# Patient Record
Sex: Female | Born: 1990 | Race: White | Hispanic: No | Marital: Married | Smoking: Never smoker
Health system: Southern US, Community
[De-identification: ages and names within clinical notes are randomized; demographics above are authoritative.]

## PROBLEM LIST (undated history)

## (undated) DIAGNOSIS — Z8669 Personal history of other diseases of the nervous system and sense organs: Secondary | ICD-10-CM

## (undated) DIAGNOSIS — O139 Gestational [pregnancy-induced] hypertension without significant proteinuria, unspecified trimester: Secondary | ICD-10-CM

## (undated) DIAGNOSIS — I10 Essential (primary) hypertension: Secondary | ICD-10-CM

## (undated) HISTORY — PX: WISDOM TOOTH EXTRACTION: SHX21

## (undated) HISTORY — PX: OTHER SURGICAL HISTORY: SHX169

---

## 2017-07-08 NOTE — L&D Delivery Note (Signed)
    IOL for PPROM/GHTN  Cytotec/foley/pitocon  Delivery Note Pr progressed steadily through labor and developed some pressure OVer a 1 push 2nd stage, at 1:57 AM a viable female was delivered via Vaginal, Spontaneous (Presentation: LOA;  ).  APGAR: 9/9 ; weight pending .  After 1 minute, the cord was clamped and cut. 40 units of pitocin diluted in 1000cc LR was infused rapidly IV.  The placenta separated spontaneously and delivered via CCT and maternal pushing effort.  It was inspected and appears to be intact with a 3 VC   Anesthesia: epidrual  Episiotomy: None Lacerations:  2nd degree Suture Repair: 2.0 vicryl Est. Blood Loss (mL):  163  Mom to postpartum.  Baby to Couplet care / Skin to Skin.    The above was performed under my direct supervision by DR. Sam Gardiner SleeperBeard    Levante Simones Cresenzo-Dishmon 05/22/2018, 1:58 AM  .

## 2017-10-16 ENCOUNTER — Encounter: Payer: Self-pay | Admitting: Family Medicine

## 2017-10-16 ENCOUNTER — Ambulatory Visit (INDEPENDENT_AMBULATORY_CARE_PROVIDER_SITE_OTHER): Payer: Self-pay | Admitting: *Deleted

## 2017-10-16 DIAGNOSIS — Z3200 Encounter for pregnancy test, result unknown: Secondary | ICD-10-CM

## 2017-10-16 DIAGNOSIS — Z3201 Encounter for pregnancy test, result positive: Secondary | ICD-10-CM

## 2017-10-16 LAB — POCT PREGNANCY, URINE: Preg Test, Ur: POSITIVE — AB

## 2017-10-16 NOTE — Progress Notes (Signed)
Pt informed of +UPT. LMP - 09/13/17.  EDD 06/20/18. Medication reconciliation completed. Pt will schedule prenatal care @ [facility].

## 2017-11-27 ENCOUNTER — Ambulatory Visit (INDEPENDENT_AMBULATORY_CARE_PROVIDER_SITE_OTHER): Payer: PRIVATE HEALTH INSURANCE | Admitting: Student

## 2017-11-27 ENCOUNTER — Encounter: Payer: Self-pay | Admitting: Student

## 2017-11-27 VITALS — BP 115/71 | HR 90 | Wt 150.5 lb

## 2017-11-27 DIAGNOSIS — Z3400 Encounter for supervision of normal first pregnancy, unspecified trimester: Secondary | ICD-10-CM

## 2017-11-27 DIAGNOSIS — Z113 Encounter for screening for infections with a predominantly sexual mode of transmission: Secondary | ICD-10-CM

## 2017-11-27 DIAGNOSIS — Z124 Encounter for screening for malignant neoplasm of cervix: Secondary | ICD-10-CM

## 2017-11-27 DIAGNOSIS — O0993 Supervision of high risk pregnancy, unspecified, third trimester: Secondary | ICD-10-CM | POA: Insufficient documentation

## 2017-11-27 DIAGNOSIS — Z3401 Encounter for supervision of normal first pregnancy, first trimester: Secondary | ICD-10-CM

## 2017-11-27 LAB — POCT URINALYSIS DIP (DEVICE)
Bilirubin Urine: NEGATIVE
GLUCOSE, UA: NEGATIVE mg/dL
KETONES UR: NEGATIVE mg/dL
Nitrite: NEGATIVE
PH: 6.5 (ref 5.0–8.0)
Protein, ur: NEGATIVE mg/dL
Specific Gravity, Urine: 1.02 (ref 1.005–1.030)
UROBILINOGEN UA: 0.2 mg/dL (ref 0.0–1.0)

## 2017-11-27 NOTE — Progress Notes (Signed)
  Subjective:    Christy Zuniga is being seen today for her first obstetrical visit.  This is a planned pregnancy. She is at [redacted]w[redacted]d gestation. Her obstetrical history is significant for nothing. . Relationship with FOB: spouse, living together. Patient does intend to breast feed. Pregnancy history fully reviewed.  Patient reports no complaints.  Review of Systems:   Review of Systems  Constitutional: Negative.   HENT: Negative.   Respiratory: Negative.   Gastrointestinal: Negative.   Genitourinary: Negative.   Musculoskeletal: Negative.   Neurological: Negative.   Psychiatric/Behavioral: Negative.     Objective:     BP 115/71   Pulse 90   Wt 150 lb 8 oz (68.3 kg)   LMP 09/13/2017  Physical Exam  Constitutional: She is oriented to person, place, and time. She appears well-developed.  HENT:  Head: Normocephalic.  Neck: Normal range of motion.  Respiratory: Effort normal.  GI: Soft.  Genitourinary:  Genitourinary Comments: Normal external female genitalia; no lesions on vaginal walls or cervix. No discharge present in vagina.  Musculoskeletal: Normal range of motion.  Neurological: She is alert and oriented to person, place, and time.   Breast exam benign: no lumps or masses palpated.  Exam    Assessment:    Pregnancy: G1P0000 Patient Active Problem List   Diagnosis Date Noted  . Supervision of normal first pregnancy, antepartum 11/27/2017       Plan:     Initial labs drawn. Prenatal vitamins. Problem list reviewed and updated. AFP3 discussed: undecided. Patient will call insurance and figure out if it covers SMA, CF and panorama test.  Role of ultrasound in pregnancy discussed; fetal survey: ordered. Amniocentesis discussed: not indicated. Follow up in 8 weeks. Oriented patient to practice, including role of CNM, PA and NPS 50% of 30  min visit spent on counseling and coordination of care.  -Pap today  -Baby RX optimization -[place] ordered for 18 weeks  -Return  precautions reviewed.  Charlesetta Garibaldi [facility] 11/27/2017

## 2017-11-27 NOTE — Patient Instructions (Signed)
First Trimester of Pregnancy The first trimester of pregnancy is from week 1 until the end of week 13 (months 1 through 3). During this time, your baby will begin to develop inside you. At 6-8 weeks, the eyes and face are formed, and the heartbeat can be seen on ultrasound. At the end of 12 weeks, all the baby's organs are formed. Prenatal care is all the medical care you receive before the birth of your baby. Make sure you get good prenatal care and follow all of your doctor's instructions. Follow these instructions at home: Medicines  Take over-the-counter and prescription medicines only as told by your doctor. Some medicines are safe and some medicines are not safe during pregnancy.  Take a prenatal vitamin that contains at least 600 micrograms (mcg) of folic acid.  If you have trouble pooping (constipation), take medicine that will make your stool soft (stool softener) if your doctor approves. Eating and drinking  Eat regular, healthy meals.  Your doctor will tell you the amount of weight gain that is right for you.  Avoid raw meat and uncooked cheese.  If you feel sick to your stomach (nauseous) or throw up (vomit): ? Eat 4 or 5 small meals a day instead of 3 large meals. ? Try eating a few soda crackers. ? Drink liquids between meals instead of during meals.  To prevent constipation: ? Eat foods that are high in fiber, like fresh fruits and vegetables, whole grains, and beans. ? Drink enough fluids to keep your pee (urine) clear or pale yellow. Activity  Exercise only as told by your doctor. Stop exercising if you have cramps or pain in your lower belly (abdomen) or low back.  Do not exercise if it is too hot, too humid, or if you are in a place of great height (high altitude).  Try to avoid standing for long periods of time. Move your legs often if you must stand in one place for a long time.  Avoid heavy lifting.  Wear low-heeled shoes. Sit and stand up straight.  You  can have sex unless your doctor tells you not to. Relieving pain and discomfort  Wear a good support bra if your breasts are sore.  Take warm water baths (sitz baths) to soothe pain or discomfort caused by hemorrhoids. Use hemorrhoid cream if your doctor says it is okay.  Rest with your legs raised if you have leg cramps or low back pain.  If you have puffy, bulging veins (varicose veins) in your legs: ? Wear support hose or compression stockings as told by your doctor. ? Raise (elevate) your feet for 15 minutes, 3-4 times a day. ? Limit salt in your food. Prenatal care  Schedule your prenatal visits by the twelfth week of pregnancy.  Write down your questions. Take them to your prenatal visits.  Keep all your prenatal visits as told by your doctor. This is important. Safety  Wear your seat belt at all times when driving.  Make a list of emergency phone numbers. The list should include numbers for family, friends, the hospital, and police and fire departments. General instructions  Ask your doctor for a referral to a local prenatal class. Begin classes no later than at the start of month 6 of your pregnancy.  Ask for help if you need counseling or if you need help with nutrition. Your doctor can give you advice or tell you where to go for help.  Do not use hot tubs, steam rooms, or   saunas.  Do not douche or use tampons or scented sanitary pads.  Do not cross your legs for long periods of time.  Avoid all herbs and alcohol. Avoid drugs that are not approved by your doctor.  Do not use any tobacco products, including cigarettes, chewing tobacco, and electronic cigarettes. If you need help quitting, ask your doctor. You may get counseling or other support to help you quit.  Avoid cat litter boxes and soil used by cats. These carry germs that can cause birth defects in the baby and can cause a loss of your baby (miscarriage) or stillbirth.  Visit your dentist. At home, brush  your teeth with a soft toothbrush. Be gentle when you floss. Contact a doctor if:  You are dizzy.  You have mild cramps or pressure in your lower belly.  You have a nagging pain in your belly area.  You continue to feel sick to your stomach, you throw up, or you have watery poop (diarrhea).  You have a bad smelling fluid coming from your vagina.  You have pain when you pee (urinate).  You have increased puffiness (swelling) in your face, hands, legs, or ankles. Get help right away if:  You have a fever.  You are leaking fluid from your vagina.  You have spotting or bleeding from your vagina.  You have very bad belly cramping or pain.  You gain or lose weight rapidly.  You throw up blood. It may look like coffee grounds.  You are around people who have German measles, fifth disease, or chickenpox.  You have a very bad headache.  You have shortness of breath.  You have any kind of trauma, such as from a fall or a car accident. Summary  The first trimester of pregnancy is from week 1 until the end of week 13 (months 1 through 3).  To take care of yourself and your unborn baby, you will need to eat healthy meals, take medicines only if your doctor tells you to do so, and do activities that are safe for you and your baby.  Keep all follow-up visits as told by your doctor. This is important as your doctor will have to ensure that your baby is healthy and growing well. This information is not intended to replace advice given to you by your health care provider. Make sure you discuss any questions you have with your health care provider. Document Released: 12/11/2007 Document Revised: 07/02/2016 Document Reviewed: 07/02/2016 Elsevier Interactive Patient Education  2017 Elsevier Inc.  

## 2017-11-28 DIAGNOSIS — Z3400 Encounter for supervision of normal first pregnancy, unspecified trimester: Secondary | ICD-10-CM

## 2017-11-28 LAB — OBSTETRIC PANEL, INCLUDING HIV
Antibody Screen: NEGATIVE
BASOS: 0 %
Basophils Absolute: 0 10*3/uL (ref 0.0–0.2)
EOS (ABSOLUTE): 0.1 10*3/uL (ref 0.0–0.4)
Eos: 1 %
HEMATOCRIT: 38.4 % (ref 34.0–46.6)
HIV SCREEN 4TH GENERATION: NONREACTIVE
Hemoglobin: 13.4 g/dL (ref 11.1–15.9)
Hepatitis B Surface Ag: NEGATIVE
IMMATURE GRANS (ABS): 0 10*3/uL (ref 0.0–0.1)
Immature Granulocytes: 0 %
LYMPHS: 30 %
Lymphocytes Absolute: 1.8 10*3/uL (ref 0.7–3.1)
MCH: 29.6 pg (ref 26.6–33.0)
MCHC: 34.9 g/dL (ref 31.5–35.7)
MCV: 85 fL (ref 79–97)
MONOCYTES: 7 %
Monocytes Absolute: 0.4 10*3/uL (ref 0.1–0.9)
NEUTROS PCT: 62 %
Neutrophils Absolute: 3.6 10*3/uL (ref 1.4–7.0)
PLATELETS: 223 10*3/uL (ref 150–450)
RBC: 4.52 x10E6/uL (ref 3.77–5.28)
RDW: 13.7 % (ref 12.3–15.4)
RPR Ser Ql: NONREACTIVE
RUBELLA: 7.84 {index} (ref >0.99)
Rh Factor: POSITIVE
WBC: 5.9 10*3/uL (ref 3.4–10.8)

## 2017-11-28 LAB — HEMOGLOBIN A1C
Est. average glucose Bld gHb Est-mCnc: 91 mg/dL
Hgb A1c MFr Bld: 4.8 % (ref 4.8–5.6)

## 2017-12-02 LAB — CYTOLOGY - PAP
CHLAMYDIA, DNA PROBE: NEGATIVE
Diagnosis: NEGATIVE
Neisseria Gonorrhea: NEGATIVE

## 2017-12-03 LAB — URINE CULTURE, OB REFLEX

## 2017-12-03 LAB — CULTURE, OB URINE

## 2018-01-01 ENCOUNTER — Ambulatory Visit (INDEPENDENT_AMBULATORY_CARE_PROVIDER_SITE_OTHER): Payer: Self-pay | Admitting: Student

## 2018-01-01 VITALS — BP 121/71 | HR 84 | Ht 62.0 in | Wt 152.7 lb

## 2018-01-01 DIAGNOSIS — Z3400 Encounter for supervision of normal first pregnancy, unspecified trimester: Secondary | ICD-10-CM

## 2018-01-01 NOTE — Progress Notes (Signed)
Patient ID: Christy Zuniga, female   DOB: 06/02/1991, 27 y.o.   MRN: [id number]     PRENATAL VISIT NOTE  Subjective:  Christy Zuniga is a 27 y.o. G1P0000 at 1936w5d being seen today for ongoing prenatal care.  She is currently monitored for the following issues for this low-risk pregnancy and has Supervision of normal first pregnancy, antepartum on their problem list.  Patient reports no complaints.  Contractions: Not present. Vag. Bleeding: None.  Movement: Absent. Denies leaking of fluid.   The following portions of the patient's history were reviewed and updated as appropriate: allergies, current medications, past family history, past medical history, past social history, past surgical history and problem list. Problem list updated.  Objective:   Vitals:   01/01/18 1111 01/01/18 1119  BP: 121/71   Pulse: 84   Weight: 152 lb 11.2 oz (69.3 kg)   Height:  5\' 2"  (1.575 m)    Fetal Status: Fetal Heart Rate (bpm): 153   Movement: Absent     General:  Alert, oriented and cooperative. Patient is in no acute distress.  Skin: Skin is warm and dry. No rash noted.   Cardiovascular: Normal heart rate noted  Respiratory: Normal respiratory effort, no problems with respiration noted  Abdomen: Soft, gravid, appropriate for gestational age.  Pain/Pressure: Absent     Pelvic: Cervical exam deferred        Extremities: Normal range of motion.  Edema: None  Mental Status: Normal mood and affect. Normal behavior. Normal judgment and thought content.   Assessment and Plan:  Pregnancy: G1P0000 at 3836w5d  1. Supervision of normal first pregnancy, antepartum -Patient doing well; declines genetic testing today other than CF. - Cystic fibrosis gene test - [facility] AMB BABYSCRIPTS SCHEDULE OPTIMIZATION -BP cuff to be sent to her house again.   Preterm labor symptoms and general obstetric precautions including but not limited to vaginal bleeding, contractions, leaking of fluid and fetal movement were reviewed in  detail with the patient. Please refer to After Visit Summary for other counseling recommendations.  Return in about 5 weeks (around 02/05/2018), or LROB.  Future Appointments  Date Time Provider Department Center  01/22/2018  8:30 AM [facility] US 1 [facility]  02/04/2018  9:55 AM Judeth HornLawrence, Erin, NP [facility]    Charlesetta GaribaldiKathryn Lorraine CordovaKooistra, [place]

## 2018-01-01 NOTE — Patient Instructions (Signed)
AREA PEDIATRIC/FAMILY PRACTICE PHYSICIANS  [facility] [address], [address] [place], [place]  [address] Phone - [phone]   Fax - [phone]  [facility] [address] [place], [place] [address] Phone - [phone]   Fax - [phone]  [facility] [address] [place], [place]  [address] Phone - [phone]   Fax - [phone]  [facility] [address] [place], [place]  [address] Phone - [phone]   Fax - [phone]  [facility] [address] [place], [place]  [address] Phone - [phone]   Fax - [phone]  [facility] [address] [place], [place]  [address] Phone - [phone]   Fax - [phone]  [facility] [address] [place], [place]  [address] Phone - [phone]   Fax - [phone]  [facility] [address] [place], [place]  [address] Phone - [phone]   Fax - [phone]  [facility] [address] [place], [place]  [address] Phone - [phone]   Fax - [phone] [facility] [address] [place], [place]  [address] Phone - [phone]   Fax - [phone]  [facility] [address] [place], [place]  [address] Phone - [phone]   Fax - [phone]  [facility] [address] [place], [place]  [address] Phone - [phone]   Fax - [phone]  [facility] [address] [place], [place]  [address] Phone - [phone]   Fax - [phone]  [facility] [address] [place], [place]  [address] Phone - [phone]  [facility] [address] [place], [place]  [address] Phone - [phone]   Fax - [phone]  [facility] [address] [place], [place]  [address] Phone - [phone]   Fax - [phone]  [facility] [address] [place], [place]  [address] Phone - [phone]   Fax - [phone]  [facility] [address] [place], [place]  [address] Phone - [phone]   Fax - [phone]   [facility] [address] [place], [place]  [address] Phone - [phone]   Fax - [phone]  [facility] [address] [place], [place]  [address] Phone - [phone]   Fax - [phone]  [facility] [address] [place], [place]  [address] Phone - [phone]   Fax - [phone]  [facility] [address] [place], [place]  [address] Phone - [phone]   Fax - [phone]  [facility] [address] , [place]  [address] Phone - [phone]   Fax - [phone] [facility] [address] [place], [place]  [address] Phone - [phone]   Fax - [phone]  [facility] [address] [place], [place]  [address] Phone - [phone]   Fax - [phone]  [facility] [address] [place], [place]  [address] Phone - [phone]   Fax - [phone]  [facility] Charlene Flemming MD [address]   [address] Phone [phone]  Fax [phone]   

## 2018-01-01 NOTE — Progress Notes (Signed)
Patient states did not get bp kit for Babyscripts optimization.  Will place order again and instructed patient to notify us if she does not get the kit. Within the next 2 weeks.  

## 2018-01-07 LAB — CYSTIC FIBROSIS GENE TEST

## 2018-01-11 ENCOUNTER — Encounter: Payer: Self-pay | Admitting: Student

## 2018-01-15 ENCOUNTER — Encounter (HOSPITAL_COMMUNITY): Payer: Self-pay

## 2018-01-22 ENCOUNTER — Other Ambulatory Visit: Payer: Self-pay | Admitting: Student

## 2018-01-22 ENCOUNTER — Ambulatory Visit (HOSPITAL_COMMUNITY)
Admission: RE | Admit: 2018-01-22 | Discharge: 2018-01-22 | Disposition: A | Discharge status: Home / Self Care | Payer: PRIVATE HEALTH INSURANCE | Source: Ambulatory Visit | Attending: Student | Admitting: Student

## 2018-01-22 DIAGNOSIS — Z3A18 18 weeks gestation of pregnancy: Secondary | ICD-10-CM

## 2018-01-22 DIAGNOSIS — Z3400 Encounter for supervision of normal first pregnancy, unspecified trimester: Secondary | ICD-10-CM

## 2018-01-22 DIAGNOSIS — O359XX0 Maternal care for (suspected) fetal abnormality and damage, unspecified, not applicable or unspecified: Secondary | ICD-10-CM

## 2018-01-22 DIAGNOSIS — Z363 Encounter for antenatal screening for malformations: Secondary | ICD-10-CM

## 2018-01-22 DIAGNOSIS — O283 Abnormal ultrasonic finding on antenatal screening of mother: Secondary | ICD-10-CM | POA: Insufficient documentation

## 2018-02-02 ENCOUNTER — Encounter: Payer: Self-pay | Admitting: Student

## 2018-02-02 DIAGNOSIS — O359XX0 Maternal care for (suspected) fetal abnormality and damage, unspecified, not applicable or unspecified: Secondary | ICD-10-CM | POA: Insufficient documentation

## 2018-02-04 ENCOUNTER — Ambulatory Visit (INDEPENDENT_AMBULATORY_CARE_PROVIDER_SITE_OTHER): Payer: PRIVATE HEALTH INSURANCE | Admitting: Student

## 2018-02-04 VITALS — BP 114/72 | HR 84 | Wt 159.8 lb

## 2018-02-04 DIAGNOSIS — O359XX0 Maternal care for (suspected) fetal abnormality and damage, unspecified, not applicable or unspecified: Secondary | ICD-10-CM

## 2018-02-04 DIAGNOSIS — Z3400 Encounter for supervision of normal first pregnancy, unspecified trimester: Secondary | ICD-10-CM

## 2018-02-04 NOTE — Progress Notes (Signed)
   PRENATAL VISIT NOTE  Subjective:  Christy Zuniga is a 27 y.o. G1P0000 at 1744w4d being seen today for ongoing prenatal care.  She is currently monitored for the following issues for this low-risk pregnancy and has Supervision of normal first pregnancy, antepartum and Suspected fetal anomaly, antepartum on their problem list.  Patient reports no complaints.  Contractions: Not present. Vag. Bleeding: None.  Movement: Present. Denies leaking of fluid.   The following portions of the patient's history were reviewed and updated as appropriate: allergies, current medications, past family history, past medical history, past social history, past surgical history and problem list. Problem list updated.  Objective:   Vitals:   02/04/18 0953  BP: 114/72  Pulse: 84  Weight: 159 lb 12.8 oz (72.5 kg)    Fetal Status: Fetal Heart Rate (bpm): 153   Movement: Present     Fundal height at umbilicus  General:  Alert, oriented and cooperative. Patient is in no acute distress.  Skin: Skin is warm and dry. No rash noted.   Cardiovascular: Normal heart rate noted  Respiratory: Normal respiratory effort, no problems with respiration noted  Abdomen: Soft, gravid, appropriate for gestational age.  Pain/Pressure: Absent     Pelvic: Cervical exam deferred        Extremities: Normal range of motion.  Edema: Trace  Mental Status: Normal mood and affect. Normal behavior. Normal judgment and thought content.   Assessment and Plan:  Pregnancy: G1P0000 at 7244w4d  1. Supervision of normal first pregnancy, antepartum -doing well -[place]anning [place] going to [place] in [place] -- questions about traveling out of the country with newborn. Will defer to pediatrician of their choice for guidance but I will research prior to next visit -Pt concerned about weight gain. Reviewed expectations of weight gain during pregnancy & went over her chart so far.  -Discussed expectations for next visit, will need to be fasting for  gtt  2. Suspected fetal anomaly, antepartum, single or unspecified fetus -Unilateral CPC. See notes from Dr. Judeth CornfieldShankar. Isolated finding, otherwise normal anatomy exam. Pt offered cfDNA by Dr. Judeth CornfieldShankar. Pt & spouse decline additional testing.   Preterm labor symptoms and general obstetric precautions including but not limited to vaginal bleeding, contractions, leaking of fluid and fetal movement were reviewed in detail with the patient. Please refer to After Visit Summary for other counseling recommendations.  Return in about 8 weeks (around 04/01/2018) for Routine OB, return at 28 wks (BRX). Will need fasting labs.  No future appointments.  Judeth HornErin Louise Rawson, NP

## 2018-02-04 NOTE — Patient Instructions (Addendum)
AREA PEDIATRIC/FAMILY PRACTICE PHYSICIANS  [facility] [address] [place], [place]  [address] Phone - [phone]   Fax - [phone]  [facility] [address] [place], [place] [address] Phone - [phone]   Fax - [phone]  JACK AMOS [address] [place], [place]  [address] Phone - [phone]   Fax - [phone]  [facility] [address] [place], [place]  [address] Phone - [phone]   Fax - [phone]  [facility] [address] [place], [place]  [address] Phone - [phone]   Fax - [phone]  [facility] [address] [place], [place]  [address] Phone - [phone]   Fax - [phone]  [facility] [place] [address] [place], [place]  [address] Phone - [phone]   Fax - [phone]  [facility] AT [facility] [address] [place], [address] [place], [place]  [address] Phone - [phone]   Fax - [phone]  [facility] AT [facility] [address] [place], [place]  [address] Phone - [phone]   Fax - [phone] [facility] [place] [address] [place], [place]  [address] Phone - [phone]   Fax - [phone]  [facility] [address] [place], [place]  [address] Phone - [phone]   Fax - [phone]  [facility] [address], [address] [place], [place]  [address] Phone - [phone]   Fax - [phone]  [facility] [place] [address] [place], [place]  [address] Phone - [phone]   Fax - [phone]  [facility] [address] [place], [place]  [address] Phone - [phone]  [facility] [address] [place], [place]  [address] Phone - [phone]   Fax - [phone]  [facility] [address] [place], [place]  [address] Phone - [phone]   Fax - [phone]  [facility] [address] [place], [place]  [address] Phone - [phone]   Fax - [phone]  [facility] [address] [place], [place]  [address] Phone - [phone]   Fax - [phone]   [facility] [address] [place], [place]  [address] Phone - [phone]   Fax - [phone]  [facility] [address] [place], [place]  [address] Phone - [phone]   Fax - [phone]  DAVID RUBIN [address] [place], [place]  [address] Phone - [phone]   Fax - [phone]  [facility] [address] [place], [place]  [address] Phone - [phone]   Fax - [phone]  [place] [facility] Alita Chyle [address] [place], [place]  [address] Phone - [phone]   Fax - [phone] Gerarda Fraction [address] [place], [place]  [address] Phone - [phone]   Fax - [phone]  [facility] [address] [place], [place]  [address] Phone - [phone]   Fax - [phone]  [facility] [place] [address] [place], [place]  [address] Phone - [phone]   Fax - [phone]  [facility] [place] Wyvonne Lenz MD [address] [place] [address] Phone [phone]  Fax [phone]      Second Trimester of Pregnancy The second trimester is from week 13  through week 28, month 4 through 6. This is often the time in pregnancy that you feel your best. Often times, morning sickness has lessened or quit. You may have more energy, and you may get hungry more often. Your unborn baby (fetus) is growing rapidly. At the end of the sixth month, he or she is about 9 inches long and weighs about 1 pounds. You will likely feel the baby move (quickening) between 18 and 20 weeks of  pregnancy.  Research childbirth classes and hospital preregistration at [email or web address]  Follow these instructions at home:  Avoid all smoking, herbs, and alcohol. Avoid drugs not approved by your doctor.  Do not use any tobacco products, including cigarettes, chewing tobacco, and electronic cigarettes. If you need help quitting, ask your doctor. You may get counseling or other support to help you quit.  Only take medicine as told by your doctor. Some medicines are safe and some are not during pregnancy.  Exercise only as told by your doctor. Stop exercising if you start having cramps.  Eat regular, healthy meals.  Wear a good support bra if your breasts are tender.  Do not use hot tubs, steam rooms, or saunas.  Wear your seat belt when driving.  Avoid raw meat, uncooked cheese, and liter boxes and soil used by cats.  Take your prenatal vitamins.  Take 1500-2000 milligrams of calcium daily starting at the 20th week of pregnancy until you deliver your baby.  Try taking medicine that helps you poop (stool softener) as needed, and if your doctor approves. Eat more fiber by eating fresh fruit, vegetables, and whole grains. Drink enough fluids to keep your pee (urine) clear or pale yellow.  Take warm water baths (sitz baths) to soothe pain or discomfort caused by hemorrhoids. Use hemorrhoid cream if your doctor approves.  If you have puffy, bulging veins (varicose veins), wear support hose. Raise (elevate) your feet for 15 minutes, 3-4 times a day. Limit salt in your diet.  Avoid heavy lifting, wear low heals, and sit up straight.  Rest with your legs raised if you have leg cramps or low back pain.  Visit your dentist if you have not gone during your pregnancy. Use a soft toothbrush to brush your teeth. Be gentle when you floss.  You can have sex (intercourse) unless your doctor tells you not to.  Go to your doctor visits.  Get help if:  You feel dizzy.  You have mild  cramps or pressure in your lower belly (abdomen).  You have a nagging pain in your belly area.  You continue to feel sick to your stomach (nauseous), throw up (vomit), or have watery poop (diarrhea).  You have bad smelling fluid coming from your vagina.  You have pain with peeing (urination). Get help right away if:  You have a fever.  You are leaking fluid from your vagina.  You have spotting or bleeding from your vagina.  You have severe belly cramping or pain.  You lose or gain weight rapidly.  You have trouble catching your breath and have chest pain.  You notice sudden or extreme puffiness (swelling) of your face, hands, ankles, feet, or legs.  You have not felt the baby move in over an hour.  You have severe headaches that do not go away with medicine.  You have vision changes. This information is not intended to replace advice given to you by your health care provider. Make sure you discuss any questions you have with your health  care provider. Document Released: 09/18/2009 Document Revised: 11/30/2015 Document Reviewed: 08/25/2012 Elsevier Interactive Patient Education  2017 [employer].

## 2018-03-31 ENCOUNTER — Other Ambulatory Visit: Payer: Self-pay | Admitting: General Practice

## 2018-03-31 DIAGNOSIS — Z3400 Encounter for supervision of normal first pregnancy, unspecified trimester: Secondary | ICD-10-CM

## 2018-04-01 ENCOUNTER — Ambulatory Visit (INDEPENDENT_AMBULATORY_CARE_PROVIDER_SITE_OTHER): Payer: PRIVATE HEALTH INSURANCE | Admitting: Obstetrics and Gynecology

## 2018-04-01 ENCOUNTER — Other Ambulatory Visit: Payer: PRIVATE HEALTH INSURANCE

## 2018-04-01 ENCOUNTER — Encounter: Payer: Self-pay | Admitting: Obstetrics and Gynecology

## 2018-04-01 DIAGNOSIS — Z3400 Encounter for supervision of normal first pregnancy, unspecified trimester: Secondary | ICD-10-CM

## 2018-04-01 DIAGNOSIS — Z23 Encounter for immunization: Secondary | ICD-10-CM | POA: Diagnosis not present

## 2018-04-01 DIAGNOSIS — Z3403 Encounter for supervision of normal first pregnancy, third trimester: Secondary | ICD-10-CM

## 2018-04-01 NOTE — Progress Notes (Addendum)
   PRENATAL VISIT NOTE  Subjective:  Christy Zuniga is a 27 y.o. G1P0000 at [redacted]w[redacted]d being seen today for ongoing prenatal care.  She is currently monitored for the following issues for this low-risk pregnancy and has Supervision of normal first pregnancy, antepartum and Suspected fetal anomaly, antepartum on their problem list.  Patient reports minor itching in small area in RUQ of abdomen, but tolerable.  Contractions: Not present. Vag. Bleeding: None.  Movement: Present. Denies leaking of fluid.   The following portions of the patient's history were reviewed and updated as appropriate: allergies, current medications, past family history, past medical history, past social history, past surgical history and problem list. Problem list updated.  Objective:   Vitals:   04/01/18 0839  BP: 117/76  Pulse: 87  Weight: 174 lb 6.4 oz (79.1 kg)    Fetal Status: Fetal Heart Rate (bpm): 140 Fundal Height: 28 cm Movement: Present     General:  Alert, oriented and cooperative. Patient is in no acute distress.  Skin: Skin is warm and dry. No rash noted.   Cardiovascular: Normal heart rate noted  Respiratory: Normal respiratory effort, no problems with respiration noted  Abdomen: Soft, gravid, appropriate for gestational age.  Pain/Pressure: Absent     Pelvic: Cervical exam deferred        Extremities: Normal range of motion.  Edema: Trace  Mental Status: Normal mood and affect. Normal behavior. Normal judgment and thought content.   Assessment and Plan:  Pregnancy: G1P0000 at [redacted]w[redacted]d  1. Supervision of normal first pregnancy, antepartum - 2 hr GTT in progress - Tdap vaccine greater than or equal to 7yo IM - Flu Vaccine QUAD 36+ mos IM - Advised apply hydrocortisone cream to affected area and take Bendryl 25 mg po hs prn increased itching.  Preterm labor symptoms and general obstetric precautions including but not limited to vaginal bleeding, contractions, leaking of fluid and fetal movement were  reviewed in detail with the patient. Please refer to After Visit Summary for other counseling recommendations.  Return in about 2 weeks (around 04/15/2018) for Return OB visit.   Raelyn Mora, CNM

## 2018-04-01 NOTE — Patient Instructions (Signed)

## 2018-04-02 LAB — RPR: RPR Ser Ql: NONREACTIVE

## 2018-04-02 LAB — CBC
HEMATOCRIT: 37.3 % (ref 34.0–46.6)
HEMOGLOBIN: 13 g/dL (ref 11.1–15.9)
MCH: 30.7 pg (ref 26.6–33.0)
MCHC: 34.9 g/dL (ref 31.5–35.7)
MCV: 88 fL (ref 79–97)
Platelets: 220 10*3/uL (ref 150–450)
RBC: 4.24 x10E6/uL (ref 3.77–5.28)
RDW: 12.7 % (ref 12.3–15.4)
WBC: 10.6 10*3/uL (ref 3.4–10.8)

## 2018-04-02 LAB — GLUCOSE TOLERANCE, 2 HOURS W/ 1HR
GLUCOSE, 2 HOUR: 116 mg/dL (ref 65–152)
Glucose, 1 hour: 113 mg/dL (ref 65–179)
Glucose, Fasting: 83 mg/dL (ref 65–91)

## 2018-04-02 LAB — HIV ANTIBODY (ROUTINE TESTING W REFLEX): HIV SCREEN 4TH GENERATION: NONREACTIVE

## 2018-04-14 ENCOUNTER — Ambulatory Visit (INDEPENDENT_AMBULATORY_CARE_PROVIDER_SITE_OTHER): Payer: PRIVATE HEALTH INSURANCE | Admitting: Obstetrics and Gynecology

## 2018-04-14 DIAGNOSIS — Z3400 Encounter for supervision of normal first pregnancy, unspecified trimester: Secondary | ICD-10-CM

## 2018-04-14 DIAGNOSIS — R1011 Right upper quadrant pain: Secondary | ICD-10-CM | POA: Insufficient documentation

## 2018-04-14 NOTE — Progress Notes (Signed)
   PRENATAL VISIT NOTE  Subjective:  Christy Zuniga is a 27 y.o. G1P0000 at [redacted]w[redacted]d being seen today for ongoing prenatal care.  She is currently monitored for the following issues for this low-risk pregnancy and has Supervision of normal first pregnancy, antepartum; Suspected fetal anomaly, antepartum; and RUQ pain on their problem list.  Patient reports RUQ pain X 12 weeks .Says it is an annoying pain that is at times deep/sometimes sharp.  No nausea and vomiting. Pain does not worsen after eating.  Contractions: Not present. Vag. Bleeding: None.  Movement: Present. Denies leaking of fluid.   The following portions of the patient's history were reviewed and updated as appropriate: allergies, current medications, past family history, past medical history, past social history, past surgical history and problem list. Problem list updated.  Objective:   Vitals:   04/14/18 0919  BP: 125/73  Pulse: 92  Weight: 176 lb (79.8 kg)    Fetal Status: Fetal Heart Rate (bpm): 149 Fundal Height: 31 cm Movement: Present     General:  Alert, oriented and cooperative. Patient is in no acute distress.  Skin: Skin is warm and dry. No rash noted.   Cardiovascular: Normal heart rate noted  Respiratory: Normal respiratory effort, no problems with respiration noted  Abdomen: Soft, gravid, appropriate for gestational age.  Pain/Pressure: Absent     Pelvic: Cervical exam deferred        Extremities: Normal range of motion.  Edema: Trace  Mental Status: Normal mood and affect. Normal behavior. Normal judgment and thought content.   Assessment and Plan:  Pregnancy: G1P0000 at [redacted]w[redacted]d  1. RUQ pain - US Abdomen Limited RUQ; Future  2. Supervision of normal first pregnancy, antepartum - discussed previous labs.   There are no diagnoses linked to this encounter. Preterm labor symptoms and general obstetric precautions including but not limited to vaginal bleeding, contractions, leaking of fluid and fetal movement  were reviewed in detail with the patient. Please refer to After Visit Summary for other counseling recommendations.  Return in about 2 weeks (around 04/28/2018).  Future Appointments  Date Time Provider Department Center  04/20/2018  9:30 AM [facility] 3 [facility]  04/29/2018 11:15 AM Katrinka Blazing, [place], CNM [facility]    Venia Carbon, NP

## 2018-04-14 NOTE — Patient Instructions (Signed)
Low-Fat Diet for Pancreatitis or Gallbladder Conditions A low-fat diet can be helpful if you have pancreatitis or a gallbladder condition. With these conditions, your pancreas and gallbladder have trouble digesting fats. A healthy eating plan with less fat will help rest your pancreas and gallbladder and reduce your symptoms. What do I need to know about this diet?  Eat a low-fat diet. ? Reduce your fat intake to less than 20-30% of your total daily calories. This is less than 50-60 g of fat per day. ? Remember that you need some fat in your diet. Ask your dietician what your daily goal should be. ? Choose nonfat and low-fat healthy foods. Look for the words "nonfat," "low fat," or "fat free." ? As a guide, look on the label and choose foods with less than 3 g of fat per serving. Eat only one serving.  Avoid alcohol.  Do not smoke. If you need help quitting, talk with your health care provider.  Eat small frequent meals instead of three large heavy meals. What foods can I eat? Grains Include healthy grains and starches such as potatoes, wheat bread, fiber-rich cereal, and [place] rice. Choose whole grain options whenever possible. In adults, whole grains should account for 45-65% of your daily calories. Fruits and Vegetables Eat plenty of fruits and vegetables. Fresh fruits and vegetables add fiber to your diet. Meats and Other Protein Sources Eat lean meat such as chicken and pork. Trim any fat off of meat before cooking it. Eggs, fish, and beans are other sources of protein. In adults, these foods should account for 10-35% of your daily calories. Dairy Choose low-fat milk and dairy options. Dairy includes fat and protein, as well as calcium. Fats and Oils Limit high-fat foods such as fried foods, sweets, baked goods, sugary drinks. Other Creamy sauces and condiments, such as mayonnaise, can add extra fat. Think about whether or not you need to use them, or use smaller amounts or low fat  options. What foods are not recommended?  High fat foods, such as: ? Baked goods. ? Ice cream. ? French toast. ? Sweet rolls. ? Pizza. ? Cheese bread. ? Foods covered with batter, butter, creamy sauces, or cheese. ? Fried foods. ? Sugary drinks and desserts.  Foods that cause gas or bloating This information is not intended to replace advice given to you by your health care provider. Make sure you discuss any questions you have with your health care provider. Document Released: 06/29/2013 Document Revised: 11/30/2015 Document Reviewed: 06/07/2013 Elsevier Interactive Patient Education  2017 [employer] Inc[employer]  

## 2018-04-20 ENCOUNTER — Ambulatory Visit (HOSPITAL_COMMUNITY)
Admission: RE | Admit: 2018-04-20 | Discharge: 2018-04-20 | Disposition: A | Discharge status: Home / Self Care | Payer: PRIVATE HEALTH INSURANCE | Source: Ambulatory Visit | Attending: Obstetrics and Gynecology | Admitting: Obstetrics and Gynecology

## 2018-04-20 DIAGNOSIS — O26893 Other specified pregnancy related conditions, third trimester: Secondary | ICD-10-CM | POA: Insufficient documentation

## 2018-04-20 DIAGNOSIS — R1011 Right upper quadrant pain: Secondary | ICD-10-CM

## 2018-04-20 DIAGNOSIS — Z3A30 30 weeks gestation of pregnancy: Secondary | ICD-10-CM | POA: Insufficient documentation

## 2018-04-29 ENCOUNTER — Ambulatory Visit (INDEPENDENT_AMBULATORY_CARE_PROVIDER_SITE_OTHER): Payer: PRIVATE HEALTH INSURANCE | Admitting: Advanced Practice Midwife

## 2018-04-29 VITALS — BP 127/89 | HR 89 | Wt 183.6 lb

## 2018-04-29 DIAGNOSIS — O0993 Supervision of high risk pregnancy, unspecified, third trimester: Secondary | ICD-10-CM

## 2018-04-29 DIAGNOSIS — Z362 Encounter for other antenatal screening follow-up: Secondary | ICD-10-CM

## 2018-04-29 DIAGNOSIS — O133 Gestational [pregnancy-induced] hypertension without significant proteinuria, third trimester: Secondary | ICD-10-CM | POA: Insufficient documentation

## 2018-04-29 DIAGNOSIS — Z3A33 33 weeks gestation of pregnancy: Secondary | ICD-10-CM

## 2018-04-29 LAB — POCT URINALYSIS DIP (DEVICE)
BILIRUBIN URINE: NEGATIVE
Glucose, UA: NEGATIVE mg/dL
HGB URINE DIPSTICK: NEGATIVE
KETONES UR: NEGATIVE mg/dL
Leukocytes, UA: NEGATIVE
Nitrite: NEGATIVE
PH: 5.5 (ref 5.0–8.0)
Protein, ur: NEGATIVE mg/dL
SPECIFIC GRAVITY, URINE: 1.01 (ref 1.005–1.030)
Urobilinogen, UA: 0.2 mg/dL (ref 0.0–1.0)

## 2018-04-29 NOTE — Progress Notes (Signed)
C/o wrist pain. BP elevated today and once at home ( noted in babyscripts) .Denies headaches or visual disturbances.

## 2018-04-29 NOTE — Patient Instructions (Addendum)
Preeclampsia and Eclampsia °Preeclampsia is a serious condition that develops only during pregnancy. It is also called toxemia of pregnancy. This condition causes high blood pressure along with other symptoms, such as swelling and headaches. These symptoms may develop as the condition gets worse. Preeclampsia may occur at 20 weeks of pregnancy or later. °Diagnosing and treating preeclampsia early is very important. If not treated early, it can cause serious problems for you and your baby. One problem it can lead to is eclampsia, which is a condition that causes muscle jerking or shaking (convulsions or seizures) in the mother. Delivering your baby is the best treatment for preeclampsia or eclampsia. Preeclampsia and eclampsia symptoms usually go away after your baby is born. °What are the causes? °The cause of preeclampsia is not known. °What increases the risk? °The following risk factors make you more likely to develop preeclampsia: °· Being pregnant for the first time. °· Having had preeclampsia during a past pregnancy. °· Having a family history of preeclampsia. °· Having high blood pressure. °· Being pregnant with twins or triplets. °· Being 35 or older. °· Being African-American. °· Having kidney disease or diabetes. °· Having medical conditions such as lupus or blood diseases. °· Being very overweight (obese). ° °What are the signs or symptoms? °The earliest signs of preeclampsia are: °· High blood pressure. °· Increased protein in your urine. Your health care provider will check for this at every visit before you give birth (prenatal visit). ° °Other symptoms that may develop as the condition gets worse include: °· Severe headaches. °· Sudden weight gain. °· Swelling of the hands, face, legs, and feet. °· Nausea and vomiting. °· Vision problems, such as blurred or double vision. °· Numbness in the face, arms, legs, and feet. °· Urinating less than usual. °· Dizziness. °· Slurred speech. °· Abdominal pain,  especially upper abdominal pain. °· Convulsions or seizures. ° °Symptoms generally go away after giving birth. °How is this diagnosed? °There are no screening tests for preeclampsia. Your health care provider will ask you about symptoms and check for signs of preeclampsia during your prenatal visits. You may also have tests that include: °· Urine tests. °· Blood tests. °· Checking your blood pressure. °· Monitoring your baby’s heart rate. °· Ultrasound. ° °How is this treated? °You and your health care provider will determine the treatment approach that is best for you. Treatment may include: °· Having more frequent prenatal exams to check for signs of preeclampsia, if you have an increased risk for preeclampsia. °· Bed rest. °· Reducing how much salt (sodium) you eat. °· Medicine to lower your blood pressure. °· Staying in the hospital, if your condition is severe. There, treatment will focus on controlling your blood pressure and the amount of fluids in your body (fluid retention). °· You may need to take medicine (magnesium sulfate) to prevent seizures. This medicine may be given as an injection or through an IV tube. °· Delivering your baby early, if your condition gets worse. You may have your labor started with medicine (induced), or you may have a cesarean delivery. ° °Follow these instructions at home: °Eating and drinking ° °· Drink enough fluid to keep your urine clear or pale yellow. °· Eat a healthy diet that is low in sodium. Do not add salt to your food. Check nutrition labels to see how much sodium a food or beverage contains. °· Avoid caffeine. °Lifestyle °· Do not use any products that contain nicotine or tobacco, such as cigarettes   and e-cigarettes. If you need help quitting, ask your health care provider. °· Do not use alcohol or drugs. °· Avoid stress as much as possible. Rest and get plenty of sleep. °General instructions °· Take over-the-counter and prescription medicines only as told by your  health care provider. °· When lying down, lie on your side. This keeps pressure off of your baby. °· When sitting or lying down, raise (elevate) your feet. Try putting some pillows underneath your lower legs. °· Exercise regularly. Ask your health care provider what kinds of exercise are best for you. °· Keep all follow-up and prenatal visits as told by your health care provider. This is important. °How is this prevented? °To prevent preeclampsia or eclampsia from developing during another pregnancy: °· Get proper medical care during pregnancy. Your health care provider may be able to prevent preeclampsia or diagnose and treat it early. °· Your health care provider may have you take a low-dose aspirin or a calcium supplement during your next pregnancy. °· You may have tests of your blood pressure and kidney function after giving birth. °· Maintain a healthy weight. Ask your health care provider for help managing weight gain during pregnancy. °· Work with your health care provider to manage any long-term (chronic) health conditions you have, such as diabetes or kidney problems. ° °Contact a health care provider if: °· You gain more weight than expected. °· You have headaches. °· You have nausea or vomiting. °· You have abdominal pain. °· You feel dizzy or light-headed. °Get help right away if: °· You develop sudden or severe swelling anywhere in your body. This usually happens in the legs. °· You gain 5 lbs (2.3 kg) or more during one week. °· You have severe: °? Abdominal pain. °? Headaches. °? Dizziness. °? Vision problems. °? Confusion. °? Nausea or vomiting. °· You have a seizure. °· You have trouble moving any part of your body. °· You develop numbness in any part of your body. °· You have trouble speaking. °· You have any abnormal bleeding. °· You pass out. °This information is not intended to replace advice given to you by your health care provider. Make sure you discuss any questions you have with your health  care provider. °Document Released: 06/21/2000 Document Revised: 02/20/2016 Document Reviewed: 01/29/2016 °Elsevier Interactive Patient Education © 2018 [employer] ° °Braxton Hicks Contractions °Contractions of the uterus can occur throughout pregnancy, but they are not always a sign that you are in labor. You may have practice contractions called Braxton Hicks contractions. These false labor contractions are sometimes confused with true labor. °What are Braxton Hicks contractions? °Braxton Hicks contractions are tightening movements that occur in the muscles of the uterus before labor. Unlike true labor contractions, these contractions do not result in opening (dilation) and thinning of the cervix. Toward the end of pregnancy (32-34 weeks), Braxton Hicks contractions can happen more often and may become stronger. These contractions are sometimes difficult to tell apart from true labor because they can be very uncomfortable. You should not feel embarrassed if you go to the hospital with false labor. °Sometimes, the only way to tell if you are in true labor is for your health care provider to look for changes in the cervix. The health care provider will do a physical exam and may monitor your contractions. If you are not in true labor, the exam should show that your cervix is not dilating and your water has not broken. °If there are other health problems   associated with your pregnancy, it is completely safe for you to be sent home with false labor. You may continue to have Braxton Hicks contractions until you go into true labor. °How to tell the difference between true labor and false labor °True labor °· Contractions last 30-70 seconds. °· Contractions become very regular. °· Discomfort is usually felt in the top of the uterus, and it spreads to the lower abdomen and low back. °· Contractions do not go away with walking. °· Contractions usually become more intense and increase in frequency. °· The cervix dilates  and gets thinner. °False labor °· Contractions are usually shorter and not as strong as true labor contractions. °· Contractions are usually irregular. °· Contractions are often felt in the front of the lower abdomen and in the groin. °· Contractions may go away when you walk around or change positions while lying down. °· Contractions get weaker and are shorter-lasting as time goes on. °· The cervix usually does not dilate or become thin. °Follow these instructions at home: °· Take over-the-counter and prescription medicines only as told by your health care provider. °· Keep up with your usual exercises and follow other instructions from your health care provider. °· Eat and drink lightly if you think you are going into labor. °· If Braxton Hicks contractions are making you uncomfortable: °? Change your position from lying down or resting to walking, or change from walking to resting. °? Sit and rest in a tub of warm water. °? Drink enough fluid to keep your urine pale yellow. Dehydration may cause these contractions. °? Do slow and deep breathing several times an hour. °· Keep all follow-up prenatal visits as told by your health care provider. This is important. °Contact a health care provider if: °· You have a fever. °· You have continuous pain in your abdomen. °Get help right away if: °· Your contractions become stronger, more regular, and closer together. °· You have fluid leaking or gushing from your vagina. °· You pass blood-tinged mucus (bloody show). °· You have bleeding from your vagina. °· You have low back pain that you never had before. °· You feel your baby’s head pushing down and causing pelvic pressure. °· Your baby is not moving inside you as much as it used to. °Summary °· Contractions that occur before labor are called Braxton Hicks contractions, false labor, or practice contractions. °· Braxton Hicks contractions are usually shorter, weaker, farther apart, and less regular than true labor  contractions. True labor contractions usually become progressively stronger and regular and they become more frequent. °· Manage discomfort from Braxton Hicks contractions by changing position, resting in a warm bath, drinking plenty of water, or practicing deep breathing. °This information is not intended to replace advice given to you by your health care provider. Make sure you discuss any questions you have with your health care provider. °Document Released: 11/07/2016 Document Revised: 11/07/2016 Document Reviewed: 11/07/2016 °Elsevier Interactive Patient Education © 2018 [employer]. ° °

## 2018-04-29 NOTE — Progress Notes (Signed)
   PRENATAL VISIT NOTE  Subjective:  Christy Zuniga is a 27 y.o. G1P0000 at [redacted]w[redacted]d being seen today for ongoing prenatal care.  She is currently monitored for the following issues for this low-risk pregnancy and has Supervision of normal first pregnancy, antepartum; Suspected fetal anomaly, antepartum; and RUQ pain on their problem list.  Patient reports imtermittent numbness in right middle finger, mostly when she wakes up in the morning. . Had one mildly elevated BP on Baby Rx cuff.  Denies HA, vision changes. Has had epigastric pain for several months. Nml RUQ [place] 04/20/18. Contractions: Not present. Vag. Bleeding: None.  Movement: Present. Denies leaking of fluid.   The following portions of the patient's history were reviewed and updated as appropriate: allergies, current medications, past family history, past medical history, past social history, past surgical history and problem list. Problem list updated.  Objective:   Vitals:   04/29/18 1142 04/29/18 1146  BP: (!) 132/94 127/89  Pulse: 93 89  Weight: 183 lb 9.6 oz (83.3 kg)     Fetal Status: Fetal Heart Rate (bpm): 145 Fundal Height: 34 cm Movement: Present  Presentation: Vertex  General:  Alert, oriented and cooperative. Patient is in no acute distress.  Skin: Skin is warm and dry. No rash noted.   Cardiovascular: Normal heart rate noted  Respiratory: Normal respiratory effort, no problems with respiration noted  Abdomen: Soft, gravid, appropriate for gestational age.  Pain/Pressure: Present     Pelvic: Cervical exam deferred        Extremities: Normal range of motion.  Edema: Mild pitting, slight indentation  Mental Status: Normal mood and affect. Normal behavior. Normal judgment and thought content.   Assessment and Plan:  Pregnancy: G1P0000 at [redacted]w[redacted]d 1. Supervision of high risk first pregnancy, antepartum   2. Gestational hypertension of pregnancy in third trimester - pre-E labs and precautions.  - BP check 10/24 (unable  to come in 48 hours.   Preterm labor symptoms and general obstetric precautions including but not limited to vaginal bleeding, contractions, leaking of fluid and fetal movement were reviewed in detail with the patient. Please refer to After Visit Summary for other counseling recommendations.  Return in about 2 weeks (around 05/13/2018).  Future Appointments  Date Time Provider Department Center  05/06/2018  1:15 PM [facility] [place] 4 [facility]    Dorathy Kinsman, [place]

## 2018-04-30 ENCOUNTER — Ambulatory Visit (INDEPENDENT_AMBULATORY_CARE_PROVIDER_SITE_OTHER): Payer: PRIVATE HEALTH INSURANCE | Admitting: *Deleted

## 2018-04-30 VITALS — BP 120/76 | HR 89 | Ht 62.0 in | Wt 183.6 lb

## 2018-04-30 DIAGNOSIS — O133 Gestational [pregnancy-induced] hypertension without significant proteinuria, third trimester: Secondary | ICD-10-CM

## 2018-04-30 LAB — COMPREHENSIVE METABOLIC PANEL
ALK PHOS: 179 IU/L — AB (ref 39–117)
ALT: 14 IU/L (ref 0–32)
AST: 20 IU/L (ref 0–40)
Albumin/Globulin Ratio: 1.4 (ref 1.2–2.2)
Albumin: 3.6 g/dL (ref 3.5–5.5)
BUN/Creatinine Ratio: 13 (ref 9–23)
BUN: 8 mg/dL (ref 6–20)
Bilirubin Total: <0.2 mg/dL (ref 0.0–1.2)
CO2: 20 mmol/L (ref 20–29)
CREATININE: 0.62 mg/dL (ref 0.57–1.00)
Calcium: 9 mg/dL (ref 8.7–10.2)
Chloride: 102 mmol/L (ref 96–106)
GFR calc non Af Amer: 125 mL/min/{1.73_m2} (ref >59)
GFR, EST AFRICAN AMERICAN: 144 mL/min/{1.73_m2} (ref >59)
Globulin, Total: 2.5 g/dL (ref 1.5–4.5)
Glucose: 68 mg/dL (ref 65–99)
Potassium: 4.3 mmol/L (ref 3.5–5.2)
Sodium: 138 mmol/L (ref 134–144)
TOTAL PROTEIN: 6.1 g/dL (ref 6.0–8.5)

## 2018-04-30 LAB — CBC
HEMOGLOBIN: 13.3 g/dL (ref 11.1–15.9)
Hematocrit: 37.3 % (ref 34.0–46.6)
MCH: 30.8 pg (ref 26.6–33.0)
MCHC: 35.7 g/dL (ref 31.5–35.7)
MCV: 86 fL (ref 79–97)
PLATELETS: 207 10*3/uL (ref 150–450)
RBC: 4.32 x10E6/uL (ref 3.77–5.28)
RDW: 12.5 % (ref 12.3–15.4)
WBC: 11 10*3/uL — ABNORMAL HIGH (ref 3.4–10.8)

## 2018-04-30 LAB — PROTEIN / CREATININE RATIO, URINE
CREATININE, UR: 27.4 mg/dL
Protein, Ur: 4.1 mg/dL
Protein/Creat Ratio: 150 mg/g creat (ref 0–200)

## 2018-04-30 NOTE — Progress Notes (Signed)
Pt here for BP check.  She denies H/A or visual disturbances. Pt advised to check BP @ home in 3 days if still not having pre-e sx. If normal, resume weekly BP checks @ home as previously instructed. She understands to return to hospital if sx of pre-e develop. Next office appt on 11/6.

## 2018-05-04 ENCOUNTER — Encounter: Payer: Self-pay | Admitting: General Practice

## 2018-05-04 ENCOUNTER — Encounter: Payer: Self-pay | Admitting: Obstetrics & Gynecology

## 2018-05-04 NOTE — Progress Notes (Signed)
I agree with the RN's note and plan of care.   Venia Carbon, NP

## 2018-05-04 NOTE — Progress Notes (Unsigned)
Patient triggered in [facility] for elevated BP's. BP 131/97 and recheck 134/96. Per Dr Macon Large, will continue to monitor at home. Patient has follow up appt 11/6. Will send mychart message.

## 2018-05-06 ENCOUNTER — Other Ambulatory Visit (HOSPITAL_COMMUNITY): Payer: Self-pay | Admitting: *Deleted

## 2018-05-06 ENCOUNTER — Ambulatory Visit (HOSPITAL_COMMUNITY)
Admission: RE | Admit: 2018-05-06 | Discharge: 2018-05-06 | Disposition: A | Discharge status: Home / Self Care | Payer: PRIVATE HEALTH INSURANCE | Source: Ambulatory Visit | Attending: Advanced Practice Midwife | Admitting: Advanced Practice Midwife

## 2018-05-06 DIAGNOSIS — O139 Gestational [pregnancy-induced] hypertension without significant proteinuria, unspecified trimester: Secondary | ICD-10-CM | POA: Diagnosis not present

## 2018-05-06 DIAGNOSIS — O133 Gestational [pregnancy-induced] hypertension without significant proteinuria, third trimester: Secondary | ICD-10-CM | POA: Insufficient documentation

## 2018-05-06 DIAGNOSIS — Z362 Encounter for other antenatal screening follow-up: Secondary | ICD-10-CM | POA: Diagnosis present

## 2018-05-06 DIAGNOSIS — Z3A33 33 weeks gestation of pregnancy: Secondary | ICD-10-CM | POA: Insufficient documentation

## 2018-05-06 DIAGNOSIS — O359XX0 Maternal care for (suspected) fetal abnormality and damage, unspecified, not applicable or unspecified: Secondary | ICD-10-CM | POA: Diagnosis not present

## 2018-05-06 DIAGNOSIS — O0993 Supervision of high risk pregnancy, unspecified, third trimester: Secondary | ICD-10-CM | POA: Diagnosis not present

## 2018-05-13 ENCOUNTER — Encounter (HOSPITAL_COMMUNITY): Payer: Self-pay

## 2018-05-13 ENCOUNTER — Encounter (HOSPITAL_COMMUNITY): Payer: Self-pay | Admitting: *Deleted

## 2018-05-13 ENCOUNTER — Inpatient Hospital Stay (HOSPITAL_COMMUNITY)
Admission: AD | Admit: 2018-05-13 | Discharge: 2018-05-13 | Disposition: A | Discharge status: Home / Self Care | Payer: PRIVATE HEALTH INSURANCE | Source: Ambulatory Visit | Attending: Obstetrics and Gynecology | Admitting: Obstetrics and Gynecology

## 2018-05-13 ENCOUNTER — Ambulatory Visit (HOSPITAL_COMMUNITY)
Admission: RE | Admit: 2018-05-13 | Discharge: 2018-05-13 | Disposition: A | Discharge status: Home / Self Care | Payer: PRIVATE HEALTH INSURANCE | Source: Ambulatory Visit | Attending: Advanced Practice Midwife | Admitting: Advanced Practice Midwife

## 2018-05-13 ENCOUNTER — Ambulatory Visit (INDEPENDENT_AMBULATORY_CARE_PROVIDER_SITE_OTHER): Payer: PRIVATE HEALTH INSURANCE | Admitting: Nurse Practitioner

## 2018-05-13 ENCOUNTER — Other Ambulatory Visit: Payer: Self-pay

## 2018-05-13 ENCOUNTER — Inpatient Hospital Stay (HOSPITAL_BASED_OUTPATIENT_CLINIC_OR_DEPARTMENT_OTHER): Payer: PRIVATE HEALTH INSURANCE

## 2018-05-13 VITALS — BP 141/95 | HR 105 | Wt 192.7 lb

## 2018-05-13 DIAGNOSIS — R030 Elevated blood-pressure reading, without diagnosis of hypertension: Secondary | ICD-10-CM | POA: Diagnosis present

## 2018-05-13 DIAGNOSIS — O359XX0 Maternal care for (suspected) fetal abnormality and damage, unspecified, not applicable or unspecified: Secondary | ICD-10-CM

## 2018-05-13 DIAGNOSIS — Z3A34 34 weeks gestation of pregnancy: Secondary | ICD-10-CM | POA: Insufficient documentation

## 2018-05-13 DIAGNOSIS — O133 Gestational [pregnancy-induced] hypertension without significant proteinuria, third trimester: Secondary | ICD-10-CM

## 2018-05-13 DIAGNOSIS — O139 Gestational [pregnancy-induced] hypertension without significant proteinuria, unspecified trimester: Secondary | ICD-10-CM

## 2018-05-13 DIAGNOSIS — O0993 Supervision of high risk pregnancy, unspecified, third trimester: Secondary | ICD-10-CM

## 2018-05-13 LAB — COMPREHENSIVE METABOLIC PANEL
ALBUMIN: 3 g/dL — AB (ref 3.5–5.0)
ALT: 17 U/L (ref 0–44)
ANION GAP: 8 (ref 5–15)
AST: 23 U/L (ref 15–41)
Alkaline Phosphatase: 200 U/L — ABNORMAL HIGH (ref 38–126)
BILIRUBIN TOTAL: 0.4 mg/dL (ref 0.3–1.2)
BUN: 12 mg/dL (ref 6–20)
CO2: 18 mmol/L — ABNORMAL LOW (ref 22–32)
Calcium: 8.5 mg/dL — ABNORMAL LOW (ref 8.9–10.3)
Chloride: 107 mmol/L (ref 98–111)
Creatinine, Ser: 0.8 mg/dL (ref 0.44–1.00)
GFR calc Af Amer: >60 mL/min (ref >60)
Glucose, Bld: 100 mg/dL — ABNORMAL HIGH (ref 70–99)
POTASSIUM: 3.8 mmol/L (ref 3.5–5.1)
Sodium: 133 mmol/L — ABNORMAL LOW (ref 135–145)
TOTAL PROTEIN: 6.2 g/dL — AB (ref 6.5–8.1)

## 2018-05-13 LAB — POCT URINALYSIS DIP (DEVICE)
Bilirubin Urine: NEGATIVE
Glucose, UA: NEGATIVE mg/dL
Hgb urine dipstick: NEGATIVE
KETONES UR: NEGATIVE mg/dL
Nitrite: NEGATIVE
PH: 6.5 (ref 5.0–8.0)
PROTEIN: NEGATIVE mg/dL
Specific Gravity, Urine: 1.02 (ref 1.005–1.030)
Urobilinogen, UA: 0.2 mg/dL (ref 0.0–1.0)

## 2018-05-13 LAB — CBC
HEMATOCRIT: 37.4 % (ref 36.0–46.0)
Hemoglobin: 12.9 g/dL (ref 12.0–15.0)
MCH: 30.2 pg (ref 26.0–34.0)
MCHC: 34.5 g/dL (ref 30.0–36.0)
MCV: 87.6 fL (ref 80.0–100.0)
Platelets: 167 10*3/uL (ref 150–400)
RBC: 4.27 MIL/uL (ref 3.87–5.11)
RDW: 13.1 % (ref 11.5–15.5)
WBC: 10.7 10*3/uL — AB (ref 4.0–10.5)
nRBC: 0 % (ref 0.0–0.2)

## 2018-05-13 LAB — PROTEIN / CREATININE RATIO, URINE
Creatinine, Urine: 115 mg/dL
Protein Creatinine Ratio: 0.12 mg/mg{Cre} (ref 0.00–0.15)
Total Protein, Urine: 14 mg/dL

## 2018-05-13 NOTE — Progress Notes (Signed)
    Subjective:  Christy Zuniga is a 27 y.o. G1P0000 at [redacted]w[redacted]d being seen today for ongoing prenatal care.  She is currently monitored for the following issues for this high-risk pregnancy and has Supervision of high risk pregnancy, antepartum, third trimester and Gestational hypertension without significant proteinuria during pregnancy in third trimester, antepartum on their problem list.  Patient reports legs are swelling worse and she had a headache for 48 hours and then it resolved..  Contractions: Not present. Vag. Bleeding: None.  Movement: Present. Denies leaking of fluid.   The following portions of the patient's history were reviewed and updated as appropriate: allergies, current medications, past family history, past medical history, past social history, past surgical history and problem list. Problem list updated.  Objective:   Vitals:   05/13/18 1133 05/13/18 1141  BP: (!) 144/93 (!) 141/95  Pulse: 93 (!) 105  Weight: 192 lb 11.2 oz (87.4 kg)     Fetal Status: Fetal Heart Rate (bpm): 152 Fundal Height: 36 cm Movement: Present     General:  Alert, oriented and cooperative. Patient is in no acute distress.  Skin: Skin is warm and dry. No rash noted.   Cardiovascular: Normal heart rate noted  Respiratory: Normal respiratory effort, no problems with respiration noted  Abdomen: Soft, gravid, appropriate for gestational age. Pain/Pressure: Present     Pelvic:  Cervical exam deferred        Extremities: Normal range of motion.  Edema: Moderate pitting, indentation subsides rapidly  Mental Status: Normal mood and affect. Normal behavior. Normal judgment and thought content.   Urinalysis:      Assessment and Plan:  Pregnancy: G1P0000 at [redacted]w[redacted]d  1. Supervision of high risk pregnancy, antepartum, third trimester Doing well but has weight gain.  Second visit with elevated BP and PIH labs were normal last visit.  2. Gestational hypertension without significant proteinuria during  pregnancy in third trimester, antepartum  9 pound weight gain Headache earlier this week 2 -3+ edema in lower legs to knees Consult with Dr. Shawnie Pons - will evaluated for [facility] labs in [facility] and will go to MFM for NST and BPP today.  Report called to [facility].  Preterm labor symptoms and general obstetric precautions including but not limited to vaginal bleeding, contractions, leaking of fluid and fetal movement were reviewed in detail with the patient. Please refer to After Visit Summary for other counseling recommendations.  Return in about 1 week (around 05/20/2018).  Nolene Bernheim, RN, MSN, NP-BC Nurse Practitioner, [facility] for [employer], [facility] 05/13/2018 12:11 PM

## 2018-05-13 NOTE — Discharge Instructions (Signed)
Hypertension During Pregnancy °Hypertension, commonly called high blood pressure, is when the force of blood pumping through your arteries is too strong. Arteries are blood vessels that carry blood from the heart throughout the body. Hypertension during pregnancy can cause problems for you and your baby. Your baby may be born early (prematurely) or may not weigh as much as he or she should at birth. Very bad cases of hypertension during pregnancy can be life-threatening. °Different types of hypertension can occur during pregnancy. These include: °· Chronic hypertension. This happens when: °? You have hypertension before pregnancy and it continues during pregnancy. °? You develop hypertension before you are [redacted] weeks pregnant, and it continues during pregnancy. °· Gestational hypertension. This is hypertension that develops after the 20th week of pregnancy. °· Preeclampsia, also called toxemia of pregnancy. This is a very serious type of hypertension that develops only during pregnancy. It affects the whole body, and it can be very dangerous for you and your baby. ° °Gestational hypertension and preeclampsia usually go away within 6 weeks after your baby is born. Women who have hypertension during pregnancy have a greater chance of developing hypertension later in life or during future pregnancies. °What are the causes? °The exact cause of hypertension is not known. °What increases the risk? °There are certain factors that make it more likely for you to develop hypertension during pregnancy. These include: °· Having hypertension during a previous pregnancy or prior to pregnancy. °· Being overweight. °· Being older than age 40. °· Being pregnant for the first time or being pregnant with more than one baby. °· Becoming pregnant using fertilization methods such as IVF (in vitro fertilization). °· Having diabetes, kidney problems, or systemic lupus erythematosus. °· Having a family history of hypertension. ° °What are the  signs or symptoms? °Chronic hypertension and gestational hypertension rarely cause symptoms. Preeclampsia causes symptoms, which may include: °· Increased protein in your urine. Your health care provider will check for this at every visit before you give birth (prenatal visit). °· Severe headaches. °· Sudden weight gain. °· Swelling of the hands, face, legs, and feet. °· Nausea and vomiting. °· Vision problems, such as blurred or double vision. °· Numbness in the face, arms, legs, and feet. °· Dizziness. °· Slurred speech. °· Sensitivity to bright lights. °· Abdominal pain. °· Convulsions. ° °How is this diagnosed? °You may be diagnosed with hypertension during a routine prenatal exam. At each prenatal visit, you may: °· Have a urine test to check for high amounts of protein in your urine. °· Have your blood pressure checked. A blood pressure reading is recorded as two numbers, such as "120 over 80" (or 120/80). The first ("top") number is called the systolic pressure. It is a measure of the pressure in your arteries when your heart beats. The second ("bottom") number is called the diastolic pressure. It is a measure of the pressure in your arteries as your heart relaxes between beats. Blood pressure is measured in a unit called mm Hg. A normal blood pressure reading is: °? Systolic: below 120. °? Diastolic: below 80. ° °The type of hypertension that you are diagnosed with depends on your test results and when your symptoms developed. °· Chronic hypertension is usually diagnosed before 20 weeks of pregnancy. °· Gestational hypertension is usually diagnosed after 20 weeks of pregnancy. °· Hypertension with high amounts of protein in the urine is diagnosed as preeclampsia. °· Blood pressure measurements that stay above 160 systolic, or above 110 diastolic, are   signs of severe preeclampsia. ° °How is this treated? °Treatment for hypertension during pregnancy varies depending on the type of hypertension you have and how  serious it is. °· If you take medicines called ACE inhibitors to treat chronic hypertension, you may need to switch medicines. ACE inhibitors should not be taken during pregnancy. °· If you have gestational hypertension, you may need to take blood pressure medicine. °· If you are at risk for preeclampsia, your health care provider may recommend that you take a low-dose aspirin every day to prevent high blood pressure during your pregnancy. °· If you have severe preeclampsia, you may need to be hospitalized so you and your baby can be monitored closely. You may also need to take medicine (magnesium sulfate) to prevent seizures and to lower blood pressure. This medicine may be given as an injection or through an IV tube. °· In some cases, if your condition gets worse, you may need to deliver your baby early. ° °Follow these instructions at home: °Eating and drinking °· Drink enough fluid to keep your urine clear or pale yellow. °· Eat a healthy diet that is low in salt (sodium). Do not add salt to your food. Check food labels to see how much sodium a food or beverage contains. °Lifestyle °· Do not use any products that contain nicotine or tobacco, such as cigarettes and e-cigarettes. If you need help quitting, ask your health care provider. °· Do not use alcohol. °· Avoid caffeine. °· Avoid stress as much as possible. Rest and get plenty of sleep. °General instructions °· Take over-the-counter and prescription medicines only as told by your health care provider. °· While lying down, lie on your left side. This keeps pressure off your baby. °· While sitting or lying down, raise (elevate) your feet. Try putting some pillows under your lower legs. °· Exercise regularly. Ask your health care provider what kinds of exercise are best for you. °· Keep all prenatal and follow-up visits as told by your health care provider. This is important. °Contact a health care provider if: °· You have symptoms that your health care  provider told you may require more treatment or monitoring, such as: °? Fever. °? Vomiting. °? Headache. °Get help right away if: °· You have severe abdominal pain or vomiting that does not get better with treatment. °· You suddenly develop swelling in your hands, ankles, or face. °· You gain 4 lbs (1.8 kg) or more in 1 week. °· You develop vaginal bleeding, or you have blood in your urine. °· You do not feel your baby moving as much as usual. °· You have blurred or double vision. °· You have muscle twitching or sudden tightening (spasms). °· You have shortness of breath. °· Your lips or fingernails turn blue. °This information is not intended to replace advice given to you by your health care provider. Make sure you discuss any questions you have with your health care provider. °Document Released: 03/12/2011 Document Revised: 01/12/2016 Document Reviewed: 12/08/2015 °Elsevier Interactive Patient Education © 2018 [employer]. ° °

## 2018-05-13 NOTE — MAU Provider Note (Signed)
Chief Complaint  Patient presents with  . BP Evaluation     First Provider Initiated Contact with Patient 05/13/18 1236      S: Janashia Parco  is a 27 y.o. y.o. year old G50P0000 female at [redacted]w[redacted]d weeks gestation who presents to MAU with elevated blood pressures. Was in office today for routine visit & was sent to MAU for labs d/t elevated Bps & swelling. Diagnosed with gestational hypertension during this pregnancy. Current blood pressure medication: none  Associated symptoms: Denies Headache, denies vision changes, denies epigastric pain Contractions: denies Vaginal bleeding: denies Fetal movement: normal  O:  Patient Vitals for the past 24 hrs:  BP Temp Temp src Pulse Resp SpO2 Height Weight  05/13/18 1419 - 98 F (36.7 C) Oral - 18 - - -  05/13/18 1401 135/85 - - 85 - - - -  05/13/18 1346 (!) 145/92 - - 90 - - - -  05/13/18 1345 (!) 137/92 - - 95 - - - -  05/13/18 1246 137/90 - - 98 - - - -  05/13/18 1235 - - - - - 99 % - -  05/13/18 1229 (!) 141/99 98.4 F (36.9 C) Oral 92 20 99 % - -  05/13/18 1220 - - - - - - 5\' 2"  (1.575 m) 87.7 kg   General: NAD Heart: Regular rate Lungs: Normal rate and effort Abd: Soft, NT, Gravid, S=D Extremities: 2 + pitting Pedal edema Neuro: 2+ deep tendon reflexes, No clonus  NST:  Baseline: 145 bpm, Variability: Good {> 6 bpm), Accelerations: Reactive and Decelerations: Absent  Results for orders placed or performed during the hospital encounter of 05/13/18 (from the past 24 hour(s))  Protein / creatinine ratio, urine     Status: None   Collection Time: 05/13/18 12:39 PM  Result Value Ref Range   Creatinine, Urine 115.00 mg/dL   Total Protein, Urine 14 mg/dL   Protein Creatinine Ratio 0.12 0.00 - 0.15 mg/mg[Cre]  CBC     Status: Abnormal   Collection Time: 05/13/18 12:41 PM  Result Value Ref Range   WBC 10.7 (H) 4.0 - 10.5 K/uL   RBC 4.27 3.87 - 5.11 MIL/uL   Hemoglobin 12.9 12.0 - 15.0 g/dL   HCT 57.8 46.9 - 62.9 %   MCV 87.6 80.0 -  100.0 fL   MCH 30.2 26.0 - 34.0 pg   MCHC 34.5 30.0 - 36.0 g/dL   RDW 52.8 41.3 - 24.4 %   Platelets 167 150 - 400 K/uL   nRBC 0.0 0.0 - 0.2 %  Comprehensive metabolic panel     Status: Abnormal   Collection Time: 05/13/18 12:41 PM  Result Value Ref Range   Sodium 133 (L) 135 - 145 mmol/L   Potassium 3.8 3.5 - 5.1 mmol/L   Chloride 107 98 - 111 mmol/L   CO2 18 (L) 22 - 32 mmol/L   Glucose, Bld 100 (H) 70 - 99 mg/dL   BUN 12 6 - 20 mg/dL   Creatinine, Ser 0.10 0.44 - 1.00 mg/dL   Calcium 8.5 (L) 8.9 - 10.3 mg/dL   Total Protein 6.2 (L) 6.5 - 8.1 g/dL   Albumin 3.0 (L) 3.5 - 5.0 g/dL   AST 23 15 - 41 U/L   ALT 17 0 - 44 U/L   Alkaline Phosphatase 200 (H) 38 - 126 U/L   Total Bilirubin 0.4 0.3 - 1.2 mg/dL   GFR calc non Af Amer >60 >60 mL/min   GFR calc Af Amer >  60 >60 mL/min   Anion gap 8 5 - 15    A: [redacted]w[redacted]d week IUP Gestational hypertension -elevated BPs, none severe range -asymptomatic -PEC labs negative FHR reactive -BPP 8/8  P: Discharge home in stable condition Preeclampsia precautions. Return to maternity admissions as needed in emergencies Pt to schedule f/u in office next week  Judeth Horn, NP 05/13/2018 8:01 PM

## 2018-05-13 NOTE — Progress Notes (Signed)
States edema in legs has gotten worse last 48 hours. C/o last Thursday had bad headache- took tylenol and slept and it went away gradually over the next 24 hours. History of migraines. BP elevated . States at home also elevated. Babyscripts data elevated.

## 2018-05-13 NOTE — MAU Note (Signed)
Pt sent from clinic for BP evaluation.

## 2018-05-15 ENCOUNTER — Telehealth: Payer: Self-pay | Admitting: General Practice

## 2018-05-15 NOTE — Telephone Encounter (Signed)
Received call from [facility] for elevated blood pressure of 143/105. Reviewed with Judeth Horn who finds BP is still not within severe range, patient is okay to continue monitoring for now unless symptomatic. Called patient to check on her. Patient denies headaches, dizziness or blurry vision, reports some fatigue. Discussed continuing to monitor BP from home and someone from our front office staff will contact her with next office appt. Patient verbalized understanding & had no questions.

## 2018-05-18 ENCOUNTER — Ambulatory Visit (INDEPENDENT_AMBULATORY_CARE_PROVIDER_SITE_OTHER): Payer: PRIVATE HEALTH INSURANCE | Admitting: Family Medicine

## 2018-05-18 ENCOUNTER — Telehealth: Payer: Self-pay | Admitting: *Deleted

## 2018-05-18 VITALS — BP 136/94 | HR 98 | Wt 195.5 lb

## 2018-05-18 DIAGNOSIS — O133 Gestational [pregnancy-induced] hypertension without significant proteinuria, third trimester: Secondary | ICD-10-CM

## 2018-05-18 DIAGNOSIS — O0993 Supervision of high risk pregnancy, unspecified, third trimester: Secondary | ICD-10-CM

## 2018-05-18 LAB — POCT URINALYSIS DIP (DEVICE)
Bilirubin Urine: NEGATIVE
Glucose, UA: NEGATIVE mg/dL
Hgb urine dipstick: NEGATIVE
Ketones, ur: NEGATIVE mg/dL
Leukocytes, UA: NEGATIVE
NITRITE: NEGATIVE
Protein, ur: 30 mg/dL — AB
Specific Gravity, Urine: 1.02 (ref 1.005–1.030)
UROBILINOGEN UA: 0.2 mg/dL (ref 0.0–1.0)
pH: 6 (ref 5.0–8.0)

## 2018-05-18 LAB — OB RESULTS CONSOLE GC/CHLAMYDIA: Gonorrhea: NEGATIVE

## 2018-05-18 NOTE — Progress Notes (Signed)
Had elevated bp Friday . Denies headaches or visual changes.

## 2018-05-18 NOTE — Progress Notes (Signed)
   PRENATAL VISIT NOTE  Subjective:  Christy Zuniga is a 27 y.o. G1P0000 at 49w2dbeing seen today for ongoing prenatal care.  She is currently monitored for the following issues for this high-risk pregnancy and has Supervision of high risk pregnancy, antepartum, third trimester and Gestational hypertension without significant proteinuria during pregnancy in third trimester, antepartum on their problem list.  Patient reports no complaints.  Contractions: Not present. Vag. Bleeding: None.  Movement: Present. Denies leaking of fluid.   The following portions of the patient's history were reviewed and updated as appropriate: allergies, current medications, past family history, past medical history, past social history, past surgical history and problem list. Problem list updated.  Objective:   Vitals:   05/18/18 1325 05/18/18 1329  BP: 139/90 (!) 136/94  Pulse: 98   Weight: 195 lb 8 oz (88.7 kg)     Fetal Status: Fetal Heart Rate (bpm): 142   Movement: Present  Presentation: Vertex  General:  Alert, oriented and cooperative. Patient is in no acute distress.  Skin: Skin is warm and dry. No rash noted.   Cardiovascular: Normal heart rate noted  Respiratory: Normal respiratory effort, no problems with respiration noted  Abdomen: Soft, gravid, appropriate for gestational age.  Pain/Pressure: Present     Pelvic: Cervical exam performed Dilation: 1.5 Effacement (%): 60 Station: -2  Extremities: Normal range of motion.  Edema: Deep pitting, indentation remains for a short time  Mental Status: Normal mood and affect. Normal behavior. Normal judgment and thought content.   Assessment and Plan:  Pregnancy: G1P0000 at 37w2d1. Supervision of high risk pregnancy, antepartum, third trimester FHT and FH normal. Check GBS today.  2. Gestational hypertension without significant proteinuria during pregnancy in third trimester, antepartum Induce at 37 weeks. BPP on wednesday - Comp Met (CMET) -  CBC  Preterm labor symptoms and general obstetric precautions including but not limited to vaginal bleeding, contractions, leaking of fluid and fetal movement were reviewed in detail with the patient. Please refer to After Visit Summary for other counseling recommendations.  Return in about 1 week (around 05/25/2018) for HR OB f/u.  Future Appointments  Date Time Provider [place]11/13/2019 12:45 PM [facility] [facility]  05/27/2018 12:45 PM [facility] [facility]  05/30/2018  7:00 AM WH-[facility]ED ROOM [facility]-BSSCHED None    JaTruett MainlandDO

## 2018-05-18 NOTE — Telephone Encounter (Signed)
Received a voicemail from 05/15/18 10:59 am stating she took a bp on babyscripts and it said she should call office to check it out. Per chart review see that Babyscripts called office to report elevated blood pressure and patient was called. She has appointment today.

## 2018-05-19 LAB — CBC
HEMATOCRIT: 36.5 % (ref 34.0–46.6)
HEMOGLOBIN: 12.8 g/dL (ref 11.1–15.9)
MCH: 30.5 pg (ref 26.6–33.0)
MCHC: 35.1 g/dL (ref 31.5–35.7)
MCV: 87 fL (ref 79–97)
PLATELETS: 184 10*3/uL (ref 150–450)
RBC: 4.2 x10E6/uL (ref 3.77–5.28)
RDW: 12.9 % (ref 12.3–15.4)
WBC: 11.7 10*3/uL — AB (ref 3.4–10.8)

## 2018-05-19 LAB — GC/CHLAMYDIA PROBE AMP (~~LOC~~) NOT AT ARMC
Chlamydia: NEGATIVE
Neisseria Gonorrhea: NEGATIVE

## 2018-05-19 LAB — COMPREHENSIVE METABOLIC PANEL
ALK PHOS: 239 IU/L — AB (ref 39–117)
ALT: 14 IU/L (ref 0–32)
AST: 17 IU/L (ref 0–40)
Albumin/Globulin Ratio: 1.4 (ref 1.2–2.2)
Albumin: 3.4 g/dL — ABNORMAL LOW (ref 3.5–5.5)
BUN/Creatinine Ratio: 13 (ref 9–23)
BUN: 12 mg/dL (ref 6–20)
Bilirubin Total: <0.2 mg/dL (ref 0.0–1.2)
CO2: 20 mmol/L (ref 20–29)
CREATININE: 0.93 mg/dL (ref 0.57–1.00)
Calcium: 8.8 mg/dL (ref 8.7–10.2)
Chloride: 102 mmol/L (ref 96–106)
GFR calc Af Amer: 98 mL/min/{1.73_m2} (ref >59)
GFR calc non Af Amer: 85 mL/min/{1.73_m2} (ref >59)
GLUCOSE: 88 mg/dL (ref 65–99)
Globulin, Total: 2.5 g/dL (ref 1.5–4.5)
Potassium: 4 mmol/L (ref 3.5–5.2)
Sodium: 137 mmol/L (ref 134–144)
Total Protein: 5.9 g/dL — ABNORMAL LOW (ref 6.0–8.5)

## 2018-05-20 ENCOUNTER — Encounter (HOSPITAL_COMMUNITY): Payer: Self-pay

## 2018-05-20 ENCOUNTER — Ambulatory Visit (HOSPITAL_BASED_OUTPATIENT_CLINIC_OR_DEPARTMENT_OTHER)
Admission: RE | Admit: 2018-05-20 | Discharge: 2018-05-20 | Disposition: A | Discharge status: Home / Self Care | Payer: PRIVATE HEALTH INSURANCE | Source: Ambulatory Visit | Attending: Advanced Practice Midwife | Admitting: Advanced Practice Midwife

## 2018-05-20 DIAGNOSIS — O139 Gestational [pregnancy-induced] hypertension without significant proteinuria, unspecified trimester: Secondary | ICD-10-CM | POA: Diagnosis not present

## 2018-05-20 DIAGNOSIS — O133 Gestational [pregnancy-induced] hypertension without significant proteinuria, third trimester: Secondary | ICD-10-CM | POA: Insufficient documentation

## 2018-05-20 DIAGNOSIS — Z3A35 35 weeks gestation of pregnancy: Secondary | ICD-10-CM

## 2018-05-20 DIAGNOSIS — O359XX0 Maternal care for (suspected) fetal abnormality and damage, unspecified, not applicable or unspecified: Secondary | ICD-10-CM

## 2018-05-20 DIAGNOSIS — O358XX0 Maternal care for other (suspected) fetal abnormality and damage, not applicable or unspecified: Secondary | ICD-10-CM | POA: Insufficient documentation

## 2018-05-21 ENCOUNTER — Inpatient Hospital Stay (HOSPITAL_COMMUNITY): Payer: PRIVATE HEALTH INSURANCE | Admitting: Anesthesiology

## 2018-05-21 ENCOUNTER — Inpatient Hospital Stay (HOSPITAL_COMMUNITY)
Admission: AD | Admit: 2018-05-21 | Discharge: 2018-05-24 | DRG: 807 | Disposition: A | Discharge status: Home / Self Care | Payer: PRIVATE HEALTH INSURANCE | Attending: Obstetrics and Gynecology | Admitting: Obstetrics and Gynecology

## 2018-05-21 ENCOUNTER — Other Ambulatory Visit: Payer: Self-pay

## 2018-05-21 ENCOUNTER — Telehealth: Payer: Self-pay | Admitting: *Deleted

## 2018-05-21 ENCOUNTER — Encounter (HOSPITAL_COMMUNITY): Payer: Self-pay | Admitting: Emergency Medicine

## 2018-05-21 DIAGNOSIS — Z880 Allergy status to penicillin: Secondary | ICD-10-CM | POA: Diagnosis not present

## 2018-05-21 DIAGNOSIS — O0993 Supervision of high risk pregnancy, unspecified, third trimester: Secondary | ICD-10-CM

## 2018-05-21 DIAGNOSIS — O42013 Preterm premature rupture of membranes, onset of labor within 24 hours of rupture, third trimester: Secondary | ICD-10-CM

## 2018-05-21 DIAGNOSIS — O42913 Preterm premature rupture of membranes, unspecified as to length of time between rupture and onset of labor, third trimester: Secondary | ICD-10-CM | POA: Diagnosis present

## 2018-05-21 DIAGNOSIS — O4290 Premature rupture of membranes, unspecified as to length of time between rupture and onset of labor, unspecified weeks of gestation: Secondary | ICD-10-CM

## 2018-05-21 DIAGNOSIS — O4202 Full-term premature rupture of membranes, onset of labor within 24 hours of rupture: Secondary | ICD-10-CM | POA: Diagnosis not present

## 2018-05-21 DIAGNOSIS — O133 Gestational [pregnancy-induced] hypertension without significant proteinuria, third trimester: Secondary | ICD-10-CM

## 2018-05-21 DIAGNOSIS — Z3A35 35 weeks gestation of pregnancy: Secondary | ICD-10-CM | POA: Diagnosis not present

## 2018-05-21 DIAGNOSIS — O42919 Preterm premature rupture of membranes, unspecified as to length of time between rupture and onset of labor, unspecified trimester: Secondary | ICD-10-CM

## 2018-05-21 DIAGNOSIS — O134 Gestational [pregnancy-induced] hypertension without significant proteinuria, complicating childbirth: Secondary | ICD-10-CM | POA: Diagnosis not present

## 2018-05-21 DIAGNOSIS — O1404 Mild to moderate pre-eclampsia, complicating childbirth: Secondary | ICD-10-CM | POA: Diagnosis present

## 2018-05-21 HISTORY — DX: Gestational (pregnancy-induced) hypertension without significant proteinuria, unspecified trimester: O13.9

## 2018-05-21 HISTORY — DX: Essential (primary) hypertension: I10

## 2018-05-21 LAB — TYPE AND SCREEN
ABO/RH(D): A POS
Antibody Screen: NEGATIVE

## 2018-05-21 LAB — URINALYSIS, ROUTINE W REFLEX MICROSCOPIC
BILIRUBIN URINE: NEGATIVE
Glucose, UA: NEGATIVE mg/dL
Ketones, ur: NEGATIVE mg/dL
LEUKOCYTES UA: NEGATIVE
NITRITE: NEGATIVE
PH: 5 (ref 5.0–8.0)
PROTEIN: 30 mg/dL — AB
SPECIFIC GRAVITY, URINE: 1.02 (ref 1.005–1.030)

## 2018-05-21 LAB — COMPREHENSIVE METABOLIC PANEL
ALK PHOS: 209 U/L — AB (ref 38–126)
ALT: 16 U/L (ref 0–44)
AST: 21 U/L (ref 15–41)
Albumin: 3 g/dL — ABNORMAL LOW (ref 3.5–5.0)
Anion gap: 9 (ref 5–15)
BUN: 13 mg/dL (ref 6–20)
CHLORIDE: 108 mmol/L (ref 98–111)
CO2: 19 mmol/L — ABNORMAL LOW (ref 22–32)
CREATININE: 0.86 mg/dL (ref 0.44–1.00)
Calcium: 8.8 mg/dL — ABNORMAL LOW (ref 8.9–10.3)
Glucose, Bld: 86 mg/dL (ref 70–99)
Potassium: 4.1 mmol/L (ref 3.5–5.1)
Sodium: 136 mmol/L (ref 135–145)
TOTAL PROTEIN: 6.9 g/dL (ref 6.5–8.1)
Total Bilirubin: 0.5 mg/dL (ref 0.3–1.2)

## 2018-05-21 LAB — CBC
HCT: 39 % (ref 36.0–46.0)
Hemoglobin: 13.2 g/dL (ref 12.0–15.0)
MCH: 29.9 pg (ref 26.0–34.0)
MCHC: 33.8 g/dL (ref 30.0–36.0)
MCV: 88.2 fL (ref 80.0–100.0)
PLATELETS: 188 10*3/uL (ref 150–400)
RBC: 4.42 MIL/uL (ref 3.87–5.11)
RDW: 13.4 % (ref 11.5–15.5)
WBC: 10.8 10*3/uL — ABNORMAL HIGH (ref 4.0–10.5)
nRBC: 0 % (ref 0.0–0.2)

## 2018-05-21 LAB — AMNISURE RUPTURE OF MEMBRANE (ROM) NOT AT ARMC: Amnisure ROM: POSITIVE

## 2018-05-21 LAB — PROTEIN / CREATININE RATIO, URINE
CREATININE, URINE: 204 mg/dL
Protein Creatinine Ratio: 0.32 mg/mg{Cre} — ABNORMAL HIGH (ref 0.00–0.15)
TOTAL PROTEIN, URINE: 65 mg/dL

## 2018-05-21 LAB — CULTURE, BETA STREP (GROUP B ONLY): Strep Gp B Culture: NEGATIVE

## 2018-05-21 LAB — ABO/RH: ABO/RH(D): A POS

## 2018-05-21 MED ORDER — LACTATED RINGERS IV SOLN: 500.0000 mL | INTRAVENOUS | Status: DC | PRN

## 2018-05-21 MED ORDER — LIDOCAINE HCL (PF) 1 % IJ SOLN
30.0000 mL | INTRAMUSCULAR | Status: DC | PRN
  Filled 2018-05-21: qty 30

## 2018-05-21 MED ORDER — VANCOMYCIN HCL IN DEXTROSE 1-5 GM/200ML-% IV SOLN
1000.0000 mg | Freq: Two times a day (BID) | INTRAVENOUS | Status: DC
  Administered 2018-05-21: 1000 mg via INTRAVENOUS
  Filled 2018-05-21 (×2): qty 200

## 2018-05-21 MED ORDER — LACTATED RINGERS IV SOLN: 500.0000 mL | Freq: Once | INTRAVENOUS | Status: DC

## 2018-05-21 MED ORDER — LACTATED RINGERS IV SOLN
INTRAVENOUS | Status: DC
  Administered 2018-05-21: 16:00:00 via INTRAVENOUS
  Administered 2018-05-21: 125 mL/h via INTRAVENOUS
  Administered 2018-05-21: 16:00:00 via INTRAVENOUS

## 2018-05-21 MED ORDER — EPHEDRINE 5 MG/ML INJ
10.0000 mg | INTRAVENOUS | Status: DC | PRN
  Filled 2018-05-21: qty 2

## 2018-05-21 MED ORDER — CLINDAMYCIN PHOSPHATE 900 MG/50ML IV SOLN: 900.0000 mg | Freq: Three times a day (TID) | INTRAVENOUS | Status: DC

## 2018-05-21 MED ORDER — OXYCODONE-ACETAMINOPHEN 5-325 MG PO TABS: 1.0000 | ORAL_TABLET | ORAL | Status: DC | PRN

## 2018-05-21 MED ORDER — DIPHENHYDRAMINE HCL 50 MG/ML IJ SOLN: 12.5000 mg | INTRAMUSCULAR | Status: DC | PRN

## 2018-05-21 MED ORDER — FENTANYL 2.5 MCG/ML BUPIVACAINE 1/10 % EPIDURAL INFUSION (WH - ANES)
14.0000 mL/h | INTRAMUSCULAR | Status: DC | PRN
  Administered 2018-05-21: 14 mL/h via EPIDURAL
  Filled 2018-05-21: qty 100

## 2018-05-21 MED ORDER — BETAMETHASONE SOD PHOS & ACET 6 (3-3) MG/ML IJ SUSP
12.0000 mg | INTRAMUSCULAR | Status: DC
  Administered 2018-05-21: 12 mg via INTRAMUSCULAR
  Filled 2018-05-21: qty 2

## 2018-05-21 MED ORDER — TERBUTALINE SULFATE 1 MG/ML IJ SOLN
0.2500 mg | Freq: Once | INTRAMUSCULAR | Status: DC | PRN
  Filled 2018-05-21: qty 1

## 2018-05-21 MED ORDER — OXYCODONE-ACETAMINOPHEN 5-325 MG PO TABS: 2.0000 | ORAL_TABLET | ORAL | Status: DC | PRN

## 2018-05-21 MED ORDER — PHENYLEPHRINE 40 MCG/ML (10ML) SYRINGE FOR IV PUSH (FOR BLOOD PRESSURE SUPPORT)
80.0000 ug | PREFILLED_SYRINGE | INTRAVENOUS | Status: DC | PRN
  Filled 2018-05-21: qty 5

## 2018-05-21 MED ORDER — ONDANSETRON HCL 4 MG/2ML IJ SOLN: 4.0000 mg | Freq: Four times a day (QID) | INTRAMUSCULAR | Status: DC | PRN

## 2018-05-21 MED ORDER — MISOPROSTOL 50MCG HALF TABLET
50.0000 ug | ORAL_TABLET | ORAL | Status: DC
  Administered 2018-05-21: 50 ug via BUCCAL
  Filled 2018-05-21 (×4): qty 1

## 2018-05-21 MED ORDER — OXYTOCIN BOLUS FROM INFUSION
500.0000 mL | Freq: Once | INTRAVENOUS | Status: AC
  Administered 2018-05-22: 500 mL via INTRAVENOUS

## 2018-05-21 MED ORDER — PHENYLEPHRINE 40 MCG/ML (10ML) SYRINGE FOR IV PUSH (FOR BLOOD PRESSURE SUPPORT)
80.0000 ug | PREFILLED_SYRINGE | INTRAVENOUS | Status: DC | PRN
  Filled 2018-05-21: qty 10
  Filled 2018-05-21: qty 5

## 2018-05-21 MED ORDER — ACETAMINOPHEN 325 MG PO TABS: 650.0000 mg | ORAL_TABLET | ORAL | Status: DC | PRN

## 2018-05-21 MED ORDER — SOD CITRATE-CITRIC ACID 500-334 MG/5ML PO SOLN: 30.0000 mL | ORAL | Status: DC | PRN

## 2018-05-21 MED ORDER — OXYTOCIN 40 UNITS IN LACTATED RINGERS INFUSION - SIMPLE MED
2.5000 [IU]/h | INTRAVENOUS | Status: DC
  Filled 2018-05-21: qty 1000

## 2018-05-21 MED ORDER — LIDOCAINE HCL (PF) 1 % IJ SOLN
INTRAMUSCULAR | Status: DC | PRN
  Administered 2018-05-21 (×2): 5 mL via EPIDURAL

## 2018-05-21 MED ORDER — OXYTOCIN 40 UNITS IN LACTATED RINGERS INFUSION - SIMPLE MED
1.0000 m[IU]/min | INTRAVENOUS | Status: DC
  Administered 2018-05-21: 2 m[IU]/min via INTRAVENOUS

## 2018-05-21 NOTE — MAU Note (Signed)
Pt presents to MAU with possible ROM @ 1945 05/20/2018. Pinkish fluid. Denies pain. Denies HA. +FM. Pt states she has GHTN.

## 2018-05-21 NOTE — H&P (Signed)
Christy Zuniga is a 27 y.o. female G1P0000 @[redacted]w[redacted]d  [facility] presenting for PPROM and GHTN.  She reports some mucus discharge starting 1945 on 05/20/18 that became more watery and soaked 2 full pads in the am of 05/21/18.  She denies any cramping/contractions or vaginal bleeding. Baby is moving well.  She was diagnosed with GHTN within the last 2 weeks with IOL planned at 37 weeks. She denies h/a, epigastric pain or visual disturbances.      Nursing Staff Provider  Office Location  [facility] Dating  LMP  Language  English Anatomy US  [facility]; declined [facility]  Flu Vaccine  04/01/18 Genetic Screen  NIPS:   AFP:   First Screen:  Quad:  Declined (insurance won't cover)  TDaP vaccine   04/01/18 Hgb A1C or  GTT Early  Third trimester  Glucose, Fasting 65 - 91 mg/dL 83   Glucose, 1 hour 65 - 179 mg/dL 161113   Glucose, 2 hour 65 - 152 mg/dL 096116     Rhogam   n/a   LAB RESULTS   Feeding Plan Breast Blood Type A/Positive/-- (05/23 0938)  O pos  Contraception Undecided Antibody Negative (05/23 04540938) neg  Circumcision Yes Rubella 7.84 (05/23 [id number]) immune  Pediatrician   list given RPR Non Reactive (09/25 0830)  NR  Support Person TYLER(FOB) HBsAg Negative (05/23 [id number]) Neg  Prenatal Classes Planning to take a tour.  HIV Non Reactive (09/25 0830) NR  BTL Consent n/a GBS  (For PCN allergy, check sensitivities)   VBAC Consent  n/a Pap  normal May 2019    Hgb Electro    declined    CF   neg    SMA     Waterbirth  [ ]  Class [ ]  Consent [ ]  CNM visit      OB History    Gravida  1   Para  0   Term  0   Preterm  0   AB  0   Living  0     SAB  0   TAB  0   Ectopic  0   Multiple  0   Live Births  0          Past Medical History:  Diagnosis Date  . Hypertension    Past Surgical History:  Procedure Laterality Date  . 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 Social History:  reports that she has never smoked. She has never used smokeless tobacco. She reports that she drank about 1.0 standard drinks of alcohol per week. She reports that she does not use drugs.2     Maternal Diabetes: No Genetic Screening: Declined Maternal Ultrasounds/Referrals: Normal Fetal Ultrasounds or other Referrals:  None Maternal Substance Abuse:  No Significant Maternal Medications:  None Significant Maternal Lab Results:  Lab values include: Other:  Other Comments:  GBS unknown, PCN allergy  Review of Systems  Constitutional: Negative for chills and fever.  Respiratory: Negative for shortness of breath.   Cardiovascular: Negative for chest pain.  Gastrointestinal: Negative for abdominal pain, constipation, diarrhea and vomiting.  Neurological: Negative for dizziness and headaches.  All other systems reviewed and are negative.  Maternal Medical History:  Reason for admission: Rupture of membranes.   Contractions: Onset was 6-12 hours ago.   Perceived severity is mild.    Fetal activity: Perceived fetal  activity is normal.   Last perceived fetal movement was within the past hour.    Prenatal complications: PIH.   Prenatal Complications - Diabetes: none.      Blood pressure (!) 147/89, pulse 89, temperature 98.5 F (36.9 C), temperature source Oral, resp. rate 17, height 5\' 2"  (1.575 m), weight 88.9 kg, last menstrual period 09/13/2017, SpO2 100 %. Maternal Exam:  Uterine Assessment: Contraction strength is mild.  Contraction frequency is rare.   Abdomen: Fetal presentation: vertex  Introitus: Amniotic fluid character: clear.  Cervix: Cervix evaluated by digital exam.     Fetal Exam Fetal Monitor Review: Mode: ultrasound.   Baseline rate: 135.  Variability: moderate (6-25 bpm).   Pattern: accelerations present and no decelerations.    Fetal State Assessment: Category I - tracings are  normal.     Physical Exam  Nursing note and vitals reviewed. Constitutional: She is oriented to person, place, and time. She appears well-developed and well-nourished.  Neck: Normal range of motion.  Cardiovascular: Normal rate, regular rhythm and normal heart sounds.  Respiratory: Effort normal.  GI: Soft.  Musculoskeletal: Normal range of motion.  Neurological: She is alert and oriented to person, place, and time. She has normal reflexes.  Skin: Skin is warm and dry.  Psychiatric: She has a normal mood and affect. Her behavior is normal. Judgment and thought content normal.    Prenatal labs: ABO, Rh: A/Positive/-- (05/23 1610) Antibody: Negative (05/23 9604) Rubella: 7.84 (05/23 5409) RPR: Non Reactive (09/25 0830)  HBsAg: Negative (05/23 8119)  HIV: Non Reactive (09/25 0830)  GBS:   Unknown--pending  Assessment/Plan: 27 y.o.  G1P0000 @[redacted]w[redacted]d  with PPROM GHTN  GBS unknown, results from 11/11 pending  [facility] for PPROM after 34 weeks BMZ x 1 given, second dose if still pregnant in 24 hours Clindamycin ordered for GBS unknown, will call to obtain pending GBS results today Anticipate NSVD   Sharen Counter 05/21/2018, 3:37 PM

## 2018-05-21 NOTE — Progress Notes (Signed)
Vitals:   05/21/18 2001 05/21/18 2031  BP: (!) 135/99 (!) 148/97  Pulse: 90 91  Resp: 18 18  Temp: 98.6 F (37 C)   SpO2:     Foley out, awaiting to start pitocin (@ 2130=4 hours after last cyttoec). FHR Cat 1, minimal ctx.

## 2018-05-21 NOTE — Progress Notes (Signed)
Labor Progress Note Christy Zuniga is a 27 y.o. G1P0000 at 706w5d presented for PROM and PEC  S:  Comfortable, no c/o. Denies PEC sx.  O:  BP (!) 137/103   Pulse 91   Temp 98.5 F (36.9 C) (Oral)   Resp 18   Ht 5\' 2"  (1.575 m)   Wt 88.9 kg   LMP 09/13/2017   SpO2 100%   BMI 35.85 kg/m  EFM: baseline 135 bpm/ mod variability/ + accels/ no decels  Toco: irregular SVE: Dilation: 1.5 Effacement (%): 70 Cervical Position: Posterior Station: -3 Presentation: Vertex Exam by:: Bahambri CNM   A/P: 27 y.o. G1P0000 316w5d  1. Labor: latent 2. FWB: Cat I 3. Pain: analgesia/anesthesia prn 4. PEC- stable, asymptomatic 5. GBS unknown>Vancomycin  Foley bulb placed. Will start Cytotec. Anticipate labor progression and SVD.  Donette LarryMelanie Alean Kromer, CNM 5:20 PM

## 2018-05-21 NOTE — Anesthesia Procedure Notes (Signed)
Epidural Patient location during procedure: OB Start time: 05/21/2018 11:01 PM End time: 05/21/2018 11:08 PM  Staffing Anesthesiologist: Achille RichHodierne, Syrai Gladwin, MD Performed: anesthesiologist   Preanesthetic Checklist Completed: patient identified, site marked, pre-op evaluation, timeout performed, IV checked, risks and benefits discussed and monitors and equipment checked  Epidural Patient position: sitting Prep: DuraPrep Patient monitoring: heart rate, cardiac monitor, continuous pulse ox and blood pressure Approach: midline Location: L2-L3 Injection technique: LOR saline  Needle:  Needle type: Tuohy  Needle gauge: 17 G Needle length: 9 cm Needle insertion depth: 6 cm Catheter type: closed end flexible Catheter size: 19 Gauge Catheter at skin depth: 12 cm Test dose: negative and Other  Assessment Events: blood not aspirated, injection not painful, no injection resistance and negative IV test  Additional Notes Informed consent obtained prior to proceeding including risk of failure, 1% risk of PDPH, risk of minor discomfort and bruising.  Discussed rare but serious complications including epidural abscess, permanent nerve injury, epidural hematoma.  Discussed alternatives to epidural analgesia and patient desires to proceed.  Timeout performed pre-procedure verifying patient name, procedure, and platelet count.  Patient tolerated procedure well. Reason for block:procedure for pain

## 2018-05-21 NOTE — Anesthesia Preprocedure Evaluation (Signed)
Anesthesia Evaluation  Patient identified by MRN, date of birth, ID band Patient awake    Reviewed: Allergy & Precautions, H&P , NPO status , Patient's Chart, lab work & pertinent test results  Airway Mallampati: II   Neck ROM: full    Dental   Pulmonary neg pulmonary ROS,    breath sounds clear to auscultation       Cardiovascular hypertension,  Rhythm:regular Rate:Normal     Neuro/Psych    GI/Hepatic   Endo/Other    Renal/GU      Musculoskeletal   Abdominal   Peds  Hematology   Anesthesia Other Findings   Reproductive/Obstetrics (+) Pregnancy                             Anesthesia Physical Anesthesia Plan  ASA: I  Anesthesia Plan: Epidural   Post-op Pain Management:    Induction: Intravenous  PONV Risk Score and Plan: 2 and Treatment may vary due to age or medical condition  Airway Management Planned: Natural Airway  Additional Equipment:   Intra-op Plan:   Post-operative Plan:   Informed Consent: I have reviewed the patients History and Physical, chart, labs and discussed the procedure including the risks, benefits and alternatives for the proposed anesthesia with the patient or authorized representative who has indicated his/her understanding and acceptance.       Plan Discussed with: Anesthesiologist  Anesthesia Plan Comments:         Anesthesia Quick Evaluation  

## 2018-05-21 NOTE — Telephone Encounter (Signed)
Christy Zuniga called and left a message she started leaking pinkish fluid since about 4pm yesterday and doesn't want to overreact.  I called Christy Zuniga and she states is still trickling out a little at a time. I advised her to come to [facility] for evaluation if her membranes have ruptured. She states she can come to [facility]au in a few minutes and voices understanding.

## 2018-05-22 ENCOUNTER — Encounter (HOSPITAL_COMMUNITY): Payer: Self-pay

## 2018-05-22 DIAGNOSIS — O4202 Full-term premature rupture of membranes, onset of labor within 24 hours of rupture: Secondary | ICD-10-CM

## 2018-05-22 DIAGNOSIS — O134 Gestational [pregnancy-induced] hypertension without significant proteinuria, complicating childbirth: Secondary | ICD-10-CM

## 2018-05-22 DIAGNOSIS — Z3A35 35 weeks gestation of pregnancy: Secondary | ICD-10-CM

## 2018-05-22 LAB — CBC
HEMATOCRIT: 34.4 % — AB (ref 36.0–46.0)
HEMOGLOBIN: 12.1 g/dL (ref 12.0–15.0)
MCH: 30.9 pg (ref 26.0–34.0)
MCHC: 35.2 g/dL (ref 30.0–36.0)
MCV: 87.8 fL (ref 80.0–100.0)
NRBC: 0.2 % (ref 0.0–0.2)
Platelets: 176 10*3/uL (ref 150–400)
RBC: 3.92 MIL/uL (ref 3.87–5.11)
RDW: 13.4 % (ref 11.5–15.5)
WBC: 13.9 10*3/uL — AB (ref 4.0–10.5)

## 2018-05-22 LAB — RPR: RPR: NONREACTIVE

## 2018-05-22 MED ORDER — METHYLERGONOVINE MALEATE 0.2 MG PO TABS: 0.2000 mg | ORAL_TABLET | ORAL | Status: DC | PRN

## 2018-05-22 MED ORDER — TETANUS-DIPHTH-ACELL PERTUSSIS 5-2.5-18.5 LF-MCG/0.5 IM SUSP: 0.5000 mL | Freq: Once | INTRAMUSCULAR | Status: DC

## 2018-05-22 MED ORDER — BENZOCAINE-MENTHOL 20-0.5 % EX AERO
1.0000 "application " | INHALATION_SPRAY | CUTANEOUS | Status: DC | PRN
  Administered 2018-05-22: 1 via TOPICAL
  Filled 2018-05-22: qty 56

## 2018-05-22 MED ORDER — MEASLES, MUMPS & RUBELLA VAC IJ SOLR
0.5000 mL | Freq: Once | INTRAMUSCULAR | Status: DC
  Filled 2018-05-22: qty 0.5

## 2018-05-22 MED ORDER — BISACODYL 10 MG RE SUPP
10.0000 mg | Freq: Every day | RECTAL | Status: DC | PRN
  Administered 2018-05-23: 10 mg via RECTAL
  Filled 2018-05-22: qty 1

## 2018-05-22 MED ORDER — PRENATAL MULTIVITAMIN CH
1.0000 | ORAL_TABLET | Freq: Every day | ORAL | Status: DC
  Administered 2018-05-22 – 2018-05-23 (×2): 1 via ORAL
  Filled 2018-05-22 (×2): qty 1

## 2018-05-22 MED ORDER — FERROUS SULFATE 325 (65 FE) MG PO TABS
325.0000 mg | ORAL_TABLET | Freq: Two times a day (BID) | ORAL | Status: DC
  Administered 2018-05-22 – 2018-05-23 (×3): 325 mg via ORAL
  Filled 2018-05-22 (×4): qty 1

## 2018-05-22 MED ORDER — SIMETHICONE 80 MG PO CHEW
80.0000 mg | CHEWABLE_TABLET | ORAL | Status: DC | PRN
  Administered 2018-05-23: 80 mg via ORAL

## 2018-05-22 MED ORDER — IBUPROFEN 600 MG PO TABS
600.0000 mg | ORAL_TABLET | Freq: Four times a day (QID) | ORAL | Status: DC
  Administered 2018-05-22 – 2018-05-24 (×9): 600 mg via ORAL
  Filled 2018-05-22 (×9): qty 1

## 2018-05-22 MED ORDER — ACETAMINOPHEN 325 MG PO TABS: 650.0000 mg | ORAL_TABLET | ORAL | Status: DC | PRN

## 2018-05-22 MED ORDER — ONDANSETRON HCL 4 MG PO TABS: 4.0000 mg | ORAL_TABLET | ORAL | Status: DC | PRN

## 2018-05-22 MED ORDER — FLEET ENEMA 7-19 GM/118ML RE ENEM: 1.0000 | ENEMA | Freq: Every day | RECTAL | Status: DC | PRN

## 2018-05-22 MED ORDER — DIBUCAINE 1 % RE OINT: 1.0000 "application " | TOPICAL_OINTMENT | RECTAL | Status: DC | PRN

## 2018-05-22 MED ORDER — WITCH HAZEL-GLYCERIN EX PADS: 1.0000 "application " | MEDICATED_PAD | CUTANEOUS | Status: DC | PRN

## 2018-05-22 MED ORDER — ONDANSETRON HCL 4 MG/2ML IJ SOLN: 4.0000 mg | INTRAMUSCULAR | Status: DC | PRN

## 2018-05-22 MED ORDER — COCONUT OIL OIL
1.0000 "application " | TOPICAL_OIL | Status: DC | PRN
  Filled 2018-05-22: qty 120

## 2018-05-22 MED ORDER — METHYLERGONOVINE MALEATE 0.2 MG/ML IJ SOLN: 0.2000 mg | INTRAMUSCULAR | Status: DC | PRN

## 2018-05-22 NOTE — Lactation Note (Addendum)
This note was copied from a baby's chart. Lactation Consultation Note  Patient Name: Christy Zuniga ZOXWR'UToday's Date: 05/22/2018  LPTI currently 16 hours old.  Visitors in room.  Mom denies need at this time.  Quickly reviewed LPTI guidelines with mom.  She has LPTI green sheet.  She has baby feeding record(yellow) sheet.  Urged her to fill out all feeding,voids/poops,emesis. Gave breastfeeding Resource list and Breastfeeding consultation services handouts.Encouraged to come to [facility]. Reviewed Understanding Mother and baby.  Urged mom to call lactation for feeding observation and breastpump observation.  Mom to call as needed.  Will f/u PRN  Maternal Data    Feeding Feeding Type: Bottle Fed - Formula  LATCH Score                   Interventions    Lactation Tools Discussed/Used     Consult Status      Neomia DearHope S Annahi Short 05/22/2018, 6:48 PM

## 2018-05-22 NOTE — Anesthesia Postprocedure Evaluation (Signed)
Anesthesia Post Note  Patient: Christy Zuniga  Procedure(s) Performed: AN AD HOC LABOR EPIDURAL     Patient location during evaluation: Mother Baby Anesthesia Type: Epidural Level of consciousness: awake and alert and oriented Pain management: satisfactory to patient Vital Signs Assessment: post-procedure vital signs reviewed and stable Respiratory status: respiratory function stable Cardiovascular status: stable Postop Assessment: no headache, no backache, epidural receding, patient able to bend at knees, no signs of nausea or vomiting and adequate PO intake Anesthetic complications: no    Last Vitals:  Vitals:   05/22/18 0425 05/22/18 0530  BP: 131/89 127/88  Pulse: 73 87  Resp: 16 16  Temp: 37.1 C 37 C  SpO2: 98% 99%    Last Pain:  Vitals:   05/22/18 0626  TempSrc:   PainSc: 0-No pain   Pain Goal: Patients Stated Pain Goal: 5 (05/21/18 2230)               [name]

## 2018-05-23 LAB — CULTURE, BETA STREP (GROUP B ONLY)

## 2018-05-23 MED ORDER — AMLODIPINE BESYLATE 10 MG PO TABS
10.0000 mg | ORAL_TABLET | Freq: Every day | ORAL | Status: DC
  Administered 2018-05-23: 10 mg via ORAL
  Filled 2018-05-23 (×2): qty 1

## 2018-05-23 NOTE — Lactation Note (Addendum)
This note was copied from a baby's chart. Lactation Consultation Note Baby 42 hrs old at time of consult. Sleeping soundly.  Discussed w/mom amount of supplementation to be increased w/hours of age. Mom is BF first then supplementing w/pumped BM then giving formula. Mom stated her breast was feeling heavy. LC assessed breast. Breast are filling. Mom has small nipples, cone shaped breast w/small nipples at tip that everts the nipple slightly. Breast filling not very compressible. Edematous areola noted, more in Lt. Demonstrated reverse pressure to areola, finger roll nipple to release milk then hand expression. Breast massage encouraged after edema to areola and milk flowing well. Engorgement management, milk storage, post pumping, and pre-hand expressing discussed.  D/t edema and breast filling, LC encouraged to hand express to soften breast for easier compression for baby to obtain a deeper latch. Encouraged cheeks to breast, body alignment, positioning, comfort, and breast massage during feeding. Post pump for supplementation for next feeding.  Worked w/mom on hand expression. Mom demonstrated hand expression to do before BF to soften breast. Shells and hand pump given.  Feeding plan for now: Hand expression and reverse pressure to soften breast,  BF baby Post pump for supplementation for next feeding.   Mom verbalized plan. Understands plans change as baby and breast needs change.   Patient Name: Christy Zuniga RUEAV'WToday's Date: 05/23/2018 Reason for consult: Follow-up assessment;Late-preterm 34-36.6wks   Maternal Data    Feeding    LATCH Score       Type of Nipple: Everted at rest and after stimulation(semi flat/ very short shaft)  Comfort (Breast/Nipple): Filling, red/small blisters or bruises, mild/mod discomfort(filling)        Interventions Interventions: Breast feeding basics reviewed;Support pillows;Position options;Breast massage;Expressed milk;Hand  express;Shells;Pre-pump if needed;Reverse pressure;Breast compression;Hand pump  Lactation Tools Discussed/Used Pump Review: Milk Storage   Consult Status Consult Status: Follow-up Date: 05/24/18 Follow-up type: In-patient    Christy Zuniga 05/23/2018, 9:48 PM

## 2018-05-23 NOTE — Progress Notes (Signed)
Post Partum Day #1 Subjective: no complaints, up ad lib and tolerating PO; breast and bottlefeeding; still undecided on contraception; denies s/s pre-e  Objective:  BPs: 131/91, 128/89, 133/90 Blood pressure 133/89, pulse 84, temperature 97.7 F (36.5 C), temperature source Oral, resp. rate 20, height 5\' 2"  (1.575 m), weight 88.9 kg, last menstrual period 09/13/2017, SpO2 99 %, unknown if currently breastfeeding.  Physical Exam:  General: alert, cooperative and no distress Lochia: appropriate Uterine Fundus: firm DVT Evaluation: No evidence of DVT seen on physical exam.  Recent Labs    05/21/18 1349 05/22/18 0258  HGB 13.2 12.1  HCT 39.0 34.4*    Assessment/Plan: PPD#1 with pre-e without severe features Consistently elevated BPs Started on Norvasc 10mg  daily Plan for discharge tomorrow    LOS: 2 days   Arabella MerlesKimberly D Shonda Mandarino CNM 05/23/2018, 9:46 AM

## 2018-05-24 MED ORDER — AMLODIPINE BESYLATE 10 MG PO TABS: 10.0000 mg | ORAL_TABLET | Freq: Every day | ORAL | 1 refills | Status: DC

## 2018-05-24 MED ORDER — IBUPROFEN 800 MG PO TABS: 800.0000 mg | ORAL_TABLET | Freq: Three times a day (TID) | ORAL | 0 refills | Status: DC | PRN

## 2018-05-24 NOTE — Lactation Note (Signed)
This note was copied from a baby's chart. Lactation Consultation Note  Patient Name: Christy Zuniga Reason for consult: Follow-up assessment;1st time breastfeeding;Primapara;Late-preterm 34-36.6wks  P1 mother whose infant is now 1054 hours old.  This is a LPTI at 35+6 weeks.  Mother had just finished pumping with her personal DEBP.  She has been putting baby to breast first, supplementing with her EBM and then adding in formula.  Reviewed the volume guidelines with parents and reminded them of the increased volume of 20-30 mls after breast feeding.  Also reminded them to limit total feeding time to 30 minutes.    Parents are very knowledgeable about the feeding plan and have been diligent with following the plan.  Mother's breasts are beginning to fill.  Her areola is edematous.  Mother will do breast massage and hand expression before/after feeding to help alleviate the swelling.  Engorgement prevention/treatment discussed.  Mother has a manual pump for home use.  She has our OP phone number for questions after discharge.  Informed her of our OP lactation consultations and her pediatrician also has a Advertising copywriterlactation consultant in the office.  Encouraged mother to take advantage of follow up visits as needed.   Maternal Data Has patient been taught Hand Expression?: Yes Does the patient have breastfeeding experience prior to this delivery?: No  Feeding Feeding Type: Bottle Fed - Formula  LATCH Score                   Interventions    Lactation Tools Discussed/Used WIC Program: No   Consult Status Consult Status: Complete Date: 05/24/18 Follow-up type: In-patient    Christy Zuniga Christy Zuniga, 8:16 AM

## 2018-05-24 NOTE — Discharge Summary (Signed)
Postpartum Discharge Summary     Patient Name: Christy Zuniga DOB: 01/06/1991 MRN: [id number]  Date of admission: 05/21/2018 Delivering Provider: Allayne Stack   Date of discharge: 05/24/2018  Admitting diagnosis: 35.5 LEAKING FLUIDS Intrauterine pregnancy: [redacted]w[redacted]d     Secondary diagnosis:  Active Problems:   Premature rupture of membranes  Additional problems: Pre-eclampsia without severe features      Discharge diagnosis: Preterm Pregnancy Delivered and Preeclampsia (mild)                                                                                                Post partum procedures:none   Augmentation: AROM, Pitocin, Cytotec and Foley Balloon  Complications: None  Hospital course:  Induction of Labor With Vaginal Delivery   27 y.o. yo G1P0101 at [redacted]w[redacted]d was admitted to the hospital 05/21/2018 for induction of labor.  Indication for induction: PROM and Preeclampsia.  Patient had an uncomplicated labor course as follows: Membrane Rupture Time/Date: 7:45 PM ,05/20/2018   Intrapartum Procedures: Episiotomy: None [1]                                         Lacerations:  2nd degree [3];Perineal [11]  Patient had delivery of a Viable infant.  Information for the patient's newborn:  Velicia, Dejager [[id number]]  Delivery Method: Vaginal, Spontaneous(Filed from Delivery Summary)   05/22/2018  Details of delivery can be found in separate delivery note.  Patient had a routine postpartum course. Patient is discharged home 05/24/18.  Magnesium Sulfate recieved: No BMZ received: Yes  Physical exam  Vitals:   05/23/18 0539 05/23/18 1057 05/23/18 1410 05/23/18 2155  BP: 133/89 (!) 135/95 (!) 127/97 (!) 139/98  Pulse: 84 60 73 73  Resp: 20 18 18 18   Temp: 97.7 F (36.5 C)  98.3 F (36.8 C) 98.3 F (36.8 C)  TempSrc: Oral  Oral Oral  SpO2: 99%  100%   Weight:      Height:       General: alert, cooperative and no distress Lochia: appropriate Uterine Fundus:  firm Incision: N/A DVT Evaluation: No evidence of DVT seen on physical exam. Labs: Lab Results  Component Value Date   WBC 13.9 (H) 05/22/2018   HGB 12.1 05/22/2018   HCT 34.4 (L) 05/22/2018   MCV 87.8 05/22/2018   PLT 176 05/22/2018   CMP Latest Ref Rng & Units 05/21/2018  Glucose 70 - 99 mg/dL 86  BUN 6 - 20 mg/dL 13  Creatinine 0.86 - 5.78 mg/dL 4.69  Sodium 629 - 528 mmol/L 136  Potassium 3.5 - 5.1 mmol/L 4.1  Chloride 98 - 111 mmol/L 108  CO2 22 - 32 mmol/L 19(L)  Calcium 8.9 - 10.3 mg/dL 4.1(L)  Total Protein 6.5 - 8.1 g/dL 6.9  Total Bilirubin 0.3 - 1.2 mg/dL 0.5  Alkaline Phos 38 - 126 U/L 209(H)  AST 15 - 41 U/L 21  ALT 0 - 44 U/L 16    Discharge instruction: per After Visit Summary and "Baby  and Me Booklet".  After visit meds:  Allergies as of 05/24/2018      Reactions   Penicillins Hives   Has patient had a PCN reaction causing immediate rash, facial/tongue/throat swelling, SOB or lightheadedness with hypotension: No Has patient had a PCN reaction causing severe rash involving mucus membranes or skin necrosis: No Has patient had a PCN reaction that required hospitalization: No Has patient had a PCN reaction occurring within the last 10 years: No If all of the above answers are "NO", then may proceed with Cephalosporin use.   Dye Fdc Red [red Dye] Hives   Actually Pink Dye      Medication List    STOP taking these medications   acetaminophen 325 MG tablet Commonly known as:  TYLENOL     TAKE these medications   amLODipine 10 MG tablet Commonly known as:  NORVASC Take 1 tablet (10 mg total) by mouth daily.   ibuprofen 800 MG tablet Commonly known as:  ADVIL,MOTRIN Take 1 tablet (800 mg total) by mouth every 8 (eight) hours as needed.   multivitamin-prenatal 27-0.8 MG Tabs tablet Take 1 tablet by mouth daily at 12 noon.       Diet: routine diet  Activity: Advance as tolerated. Pelvic rest for 6 weeks.   Outpatient follow up:6 weeks Follow  up Appt: Future Appointments  Date Time Provider Department Center  05/27/2018 12:45 PM [facility] US 5 [facility]  06/18/2018 10:35 AM Rasch, Harolyn RutherfordJennifer I, NP [facility]   Follow up Visit:   Please schedule this patient for Postpartum visit in: 6 weeks with the following provider: Any provider For C/S patients schedule nurse incision check in weeks 2 weeks: no Low risk pregnancy complicated by: HTN Delivery mode:  SVD Anticipated Birth Control:  other/unsure PP Procedures needed: BP check in 2 days with RN in clinic Schedule Integrated BH visit: no   DC home on Norvasc 10mg  QD    Newborn Data: Live born female  Birth Weight: 6 lb 5.1 oz (2866 g) APGAR: 9, 9  Newborn Delivery   Birth date/time:  05/22/2018 01:57:00 Delivery type:  Vaginal, Spontaneous     Baby Feeding: Breast Disposition:home with mother   05/24/2018 Thressa ShellerHeather Emma Schupp, CNM

## 2018-05-25 ENCOUNTER — Encounter: Payer: PRIVATE HEALTH INSURANCE | Admitting: Obstetrics & Gynecology

## 2018-05-26 ENCOUNTER — Ambulatory Visit (INDEPENDENT_AMBULATORY_CARE_PROVIDER_SITE_OTHER): Payer: PRIVATE HEALTH INSURANCE | Admitting: *Deleted

## 2018-05-26 VITALS — BP 136/70 | HR 101 | Ht 62.0 in | Wt 180.8 lb

## 2018-05-26 DIAGNOSIS — Z0130 Encounter for examination of blood pressure without abnormal findings: Secondary | ICD-10-CM

## 2018-05-26 NOTE — Progress Notes (Signed)
Pt her for BP check.  BP was checked manually and was 136/70.  Pt reports the swelling in her feet has decreased.  Pt denies any headaches or visual changes.  Pt taking medication as prescribed.  Pre-eclampsia s/s reviewed.  Pt verbalized understanding.  Reviewed with Luna KitchensKathryn Kooistra CNM.  Instructed pt to follow up with her postpartum appointment as scheduled.

## 2018-05-27 ENCOUNTER — Ambulatory Visit (HOSPITAL_COMMUNITY): Payer: PRIVATE HEALTH INSURANCE

## 2018-05-28 NOTE — Progress Notes (Signed)
Chart reviewed for nurse visit. Agree with plan of care.   Marylene LandKooistra, Salomon Ganser Lorraine, CNM 05/28/2018 4:06 PM

## 2018-05-30 ENCOUNTER — Inpatient Hospital Stay (HOSPITAL_COMMUNITY)
Admission: RE | Admit: 2018-05-30 | Discharge: 2018-05-30 | Disposition: A | Discharge status: Home / Self Care | Payer: PRIVATE HEALTH INSURANCE | Source: Ambulatory Visit | Attending: Obstetrics and Gynecology | Admitting: Obstetrics and Gynecology

## 2018-06-18 ENCOUNTER — Ambulatory Visit (INDEPENDENT_AMBULATORY_CARE_PROVIDER_SITE_OTHER): Payer: Self-pay | Admitting: Obstetrics and Gynecology

## 2018-06-18 ENCOUNTER — Encounter: Payer: Self-pay | Admitting: Obstetrics and Gynecology

## 2018-06-18 DIAGNOSIS — Z3041 Encounter for surveillance of contraceptive pills: Secondary | ICD-10-CM

## 2018-06-18 DIAGNOSIS — O0993 Supervision of high risk pregnancy, unspecified, third trimester: Secondary | ICD-10-CM

## 2018-06-18 MED ORDER — NORETHINDRONE 0.35 MG PO TABS: 1.0000 | ORAL_TABLET | Freq: Every day | ORAL | 11 refills | Status: DC

## 2018-06-18 NOTE — Progress Notes (Signed)
Subjective:     Christy DeemJolyn Gaza is a 27 y.o. female who presents for a postpartum visit. She is 6 weeks postpartum following a spontaneous vaginal delivery. I have fully reviewed the prenatal and intrapartum course. The delivery was at 35.6 gestational weeks. Outcome: spontaneous vaginal delivery. Anesthesia: epidural. Postpartum course has been nothing. Baby's course has been uncomplicated. Baby is feeding by breast. Bleeding staining only. Bowel function is normal. Bladder function is normal. Patient is not sexually active. Contraception method is none. Postpartum depression screening: negative.  The following portions of the patient's history were reviewed and updated as appropriate: allergies, current medications, past family history, past medical history, past social history, past surgical history and problem list.  Review of Systems Pertinent items are noted in HPI.   Objective:    BP 129/87   Pulse 79   Wt 166 lb (75.3 kg)   LMP 09/13/2017   BMI 30.36 kg/m   General:  alert, cooperative and appears stated age  Lungs: clear to auscultation bilaterally  Heart:  regular rate and rhythm, S1, S2 normal, no murmur, click, rub or gallop  Abdomen: soft, non-tender; bowel sounds normal; no masses,  no organomegaly   Vulva:  not evaluated  Vagina: not evaluated  Cervix:  not evaluated   Corpus: not examined  Adnexa:  not examined   Rectal Exam: Not performed.        Assessment:    Normal postpartum exam. Pap smear not done at today's visit. Last Pap May 2019  Plan:   1. Contraception: oral progesterone-only contraceptive 2. GHTN: BP good today. Continue Norvasc, PCP encouraged  3. Follow up in:  or as needed.  Duane Lopeasch, Jennifer I, NP 06/18/2018 11:31 AM

## 2018-07-14 ENCOUNTER — Encounter: Payer: Self-pay | Admitting: *Deleted

## 2020-03-07 ENCOUNTER — Other Ambulatory Visit: Payer: PRIVATE HEALTH INSURANCE

## 2020-04-29 ENCOUNTER — Other Ambulatory Visit: Payer: PRIVATE HEALTH INSURANCE

## 2020-09-10 ENCOUNTER — Inpatient Hospital Stay (HOSPITAL_COMMUNITY)
Admission: AD | Admit: 2020-09-10 | Discharge: 2020-09-10 | Disposition: A | Discharge status: Home / Self Care | Payer: 59 | Attending: Obstetrics and Gynecology | Admitting: Obstetrics and Gynecology

## 2020-09-10 ENCOUNTER — Encounter (HOSPITAL_COMMUNITY): Payer: Self-pay | Admitting: Obstetrics and Gynecology

## 2020-09-10 ENCOUNTER — Other Ambulatory Visit: Payer: Self-pay

## 2020-09-10 ENCOUNTER — Inpatient Hospital Stay (HOSPITAL_COMMUNITY): Payer: 59

## 2020-09-10 DIAGNOSIS — Z79899 Other long term (current) drug therapy: Secondary | ICD-10-CM | POA: Diagnosis not present

## 2020-09-10 DIAGNOSIS — O26851 Spotting complicating pregnancy, first trimester: Secondary | ICD-10-CM | POA: Diagnosis not present

## 2020-09-10 DIAGNOSIS — R109 Unspecified abdominal pain: Secondary | ICD-10-CM | POA: Insufficient documentation

## 2020-09-10 DIAGNOSIS — Z3A01 Less than 8 weeks gestation of pregnancy: Secondary | ICD-10-CM | POA: Insufficient documentation

## 2020-09-10 DIAGNOSIS — O26891 Other specified pregnancy related conditions, first trimester: Secondary | ICD-10-CM | POA: Diagnosis not present

## 2020-09-10 DIAGNOSIS — O209 Hemorrhage in early pregnancy, unspecified: Secondary | ICD-10-CM | POA: Diagnosis not present

## 2020-09-10 DIAGNOSIS — O3680X0 Pregnancy with inconclusive fetal viability, not applicable or unspecified: Secondary | ICD-10-CM | POA: Diagnosis not present

## 2020-09-10 DIAGNOSIS — Z880 Allergy status to penicillin: Secondary | ICD-10-CM | POA: Insufficient documentation

## 2020-09-10 LAB — URINALYSIS, ROUTINE W REFLEX MICROSCOPIC
Bilirubin Urine: NEGATIVE
Glucose, UA: NEGATIVE mg/dL
Ketones, ur: NEGATIVE mg/dL
Nitrite: NEGATIVE
Protein, ur: NEGATIVE mg/dL
Specific Gravity, Urine: 1.008 (ref 1.005–1.030)
pH: 7 (ref 5.0–8.0)

## 2020-09-10 LAB — CBC
HCT: 39.7 % (ref 36.0–46.0)
Hemoglobin: 14.3 g/dL (ref 12.0–15.0)
MCH: 31 pg (ref 26.0–34.0)
MCHC: 36 g/dL (ref 30.0–36.0)
MCV: 86.1 fL (ref 80.0–100.0)
Platelets: 248 10*3/uL (ref 150–400)
RBC: 4.61 MIL/uL (ref 3.87–5.11)
RDW: 12.7 % (ref 11.5–15.5)
WBC: 7.1 10*3/uL (ref 4.0–10.5)
nRBC: 0 % (ref 0.0–0.2)

## 2020-09-10 LAB — WET PREP, GENITAL
Clue Cells Wet Prep HPF POC: NONE SEEN
Sperm: NONE SEEN
Trich, Wet Prep: NONE SEEN
Yeast Wet Prep HPF POC: NONE SEEN

## 2020-09-10 LAB — HCG, QUANTITATIVE, PREGNANCY: hCG, Beta Chain, Quant, S: 34 m[IU]/mL — ABNORMAL HIGH (ref <5)

## 2020-09-10 LAB — POCT PREGNANCY, URINE: Preg Test, Ur: POSITIVE — AB

## 2020-09-10 NOTE — MAU Note (Signed)
Christy Zuniga is a 30 y.o. here in MAU reporting: states she is about [redacted] weeks pregnant and that she started spotting yesterday afternoon. It has gotten a little bit heavier today. Had a few small clots. Occasional mild cramping.  LMP: 07/26/20  Onset of complaint: yesterday  Pain score: 0/10  Vitals:   09/10/20 1549  BP: 125/70  Pulse: 93  Resp: 16  Temp: 98.3 F (36.8 C)  SpO2: 100%     Lab orders placed from triage: upt

## 2020-09-10 NOTE — MAU Provider Note (Signed)
History     CSN: [id number]  Arrival date and time: 09/10/20 1534   Event Date/Time   First Provider Initiated Contact with Patient 09/10/20 1621      Chief Complaint  Patient presents with  . Vaginal Bleeding   HPI Christy Zuniga is a 30 y.o. G2P0101 at [redacted]w[redacted]d who presents with vaginal bleeding. She reports a positive HPT on 2/19. She states yesterday she started having bleeding that progressively got worse and became bleeding with clots. She reports it is heavy like a period. She reports intermittent left sided abdominal pain. She has not been seen anywhere for the pregnancy. She has a new OB appointment with Nestor Ramp on 3/15.  OB History    Gravida  2   Para  1   Term  0   Preterm  1   AB  0   Living  1     SAB  0   IAB  0   Ectopic  0   Multiple  0   Live Births  1           Past Medical History:  Diagnosis Date  . Hypertension   . Pregnancy induced hypertension     Past Surgical History:  Procedure Laterality Date  . 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 Cancer Mother   . Depression Father   . Heart disease Maternal Grandfather     Social History   Tobacco Use  . Smoking status: Never Smoker  . Smokeless tobacco: Never Used  Vaping Use  . Vaping Use: Never used  Substance Use Topics  . Alcohol use: Not Currently    Alcohol/week: 1.0 standard drink    Types: 1 Cans of beer per week    Comment: Occasionally  . Drug use: Never    Allergies:  Allergies  Allergen Reactions  . Penicillins Hives    Has patient had a PCN reaction causing immediate rash, facial/tongue/throat swelling, SOB or lightheadedness with hypotension: No Has patient had a PCN reaction causing severe rash involving mucus membranes or skin necrosis: No Has patient had a PCN reaction that required hospitalization: No Has patient had a PCN reaction occurring within the last 10 years:  No If all of the above answers are "NO", then may proceed with Cephalosporin use.   Bartolo Darter Fdc Red [Red Dye] Hives    Actually Pink Dye    Medications Prior to Admission  Medication Sig Dispense Refill Last Dose  . Prenatal Vit-Fe Fumarate-FA (MULTIVITAMIN-PRENATAL) 27-0.8 MG TABS tablet Take 1 tablet by mouth daily at 12 noon.   09/10/2020 at 0800  . amLODipine (NORVASC) 10 MG tablet Take 1 tablet (10 mg total) by mouth daily. 30 tablet 1   . ibuprofen (ADVIL,MOTRIN) 800 MG tablet Take 1 tablet (800 mg total) by mouth every 8 (eight) hours as needed. (Patient not taking: Reported on 06/18/2018) 30 tablet 0   . norethindrone (MICRONOR,CAMILA,ERRIN) 0.35 MG tablet Take 1 tablet (0.35 mg total) by mouth daily. 1 Package 11     Review of Systems  Constitutional: Negative.  Negative for fatigue and fever.  HENT: Negative.   Respiratory: Negative.  Negative for shortness of breath.   Cardiovascular: Negative.  Negative for chest pain.  Gastrointestinal: Positive for abdominal pain. Negative for constipation, diarrhea, nausea and vomiting.  Genitourinary: Positive for vaginal bleeding. Negative for dysuria and vaginal discharge.  Neurological: Negative.  Negative  for dizziness and headaches.   Physical Exam   Blood pressure 126/66, pulse 98, temperature 98.8 F (37.1 C), temperature source Oral, resp. rate 16, height 5\' 2"  (1.575 m), weight 67.6 kg, last menstrual period 07/26/2020, SpO2 100 %, unknown if currently breastfeeding.  Physical Exam Vitals and nursing note reviewed.  Constitutional:      General: She is not in acute distress.    Appearance: She is well-developed and well-nourished.  HENT:     Head: Normocephalic.  Eyes:     Pupils: Pupils are equal, round, and reactive to light.  Cardiovascular:     Rate and Rhythm: Normal rate and regular rhythm.     Heart sounds: Normal heart sounds.  Pulmonary:     Effort: Pulmonary effort is normal. No respiratory distress.      Breath sounds: Normal breath sounds.  Abdominal:     General: Bowel sounds are normal. There is no distension.     Palpations: Abdomen is soft.     Tenderness: There is no abdominal tenderness.  Genitourinary:    Comments: SSE: small amount of bright red blood in vault and oozing from os Skin:    General: Skin is warm and dry.  Neurological:     Mental Status: She is alert and oriented to person, place, and time.  Psychiatric:        Mood and Affect: Mood and affect normal.        Behavior: Behavior normal.        Thought Content: Thought content normal.        Judgment: Judgment normal.     MAU Course  Procedures Results for orders placed or performed during the hospital encounter of 09/10/20 (from the past 24 hour(s))  Urinalysis, Routine w reflex microscopic Urine, Clean Catch     Status: Abnormal   Collection Time: 09/10/20  3:50 PM  Result Value Ref Range   Color, Urine YELLOW YELLOW   APPearance CLEAR CLEAR   Specific Gravity, Urine 1.008 1.005 - 1.030   pH 7.0 5.0 - 8.0   Glucose, UA NEGATIVE NEGATIVE mg/dL   Hgb urine dipstick LARGE (A) NEGATIVE   Bilirubin Urine NEGATIVE NEGATIVE   Ketones, ur NEGATIVE NEGATIVE mg/dL   Protein, ur NEGATIVE NEGATIVE mg/dL   Nitrite NEGATIVE NEGATIVE   Leukocytes,Ua SMALL (A) NEGATIVE   RBC / HPF 0-5 0 - 5 RBC/hpf   WBC, UA 0-5 0 - 5 WBC/hpf   Bacteria, UA RARE (A) NONE SEEN   Squamous Epithelial / LPF 0-5 0 - 5   Mucus PRESENT   Pregnancy, urine POC     Status: Abnormal   Collection Time: 09/10/20  4:01 PM  Result Value Ref Range   Preg Test, Ur POSITIVE (A) NEGATIVE  CBC     Status: None   Collection Time: 09/10/20  4:03 PM  Result Value Ref Range   WBC 7.1 4.0 - 10.5 K/uL   RBC 4.61 3.87 - 5.11 MIL/uL   Hemoglobin 14.3 12.0 - 15.0 g/dL   HCT 11/10/20 62.9 - 47.6 %   MCV 86.1 80.0 - 100.0 fL   MCH 31.0 26.0 - 34.0 pg   MCHC 36.0 30.0 - 36.0 g/dL   RDW 54.6 50.3 - 54.6 %   Platelets 248 150 - 400 K/uL   nRBC 0.0 0.0 - 0.2  %  hCG, quantitative, pregnancy     Status: Abnormal   Collection Time: 09/10/20  4:03 PM  Result Value Ref Range  hCG, Beta Chain, Quant, S 34 (H) <5 mIU/mL  Wet prep, genital     Status: Abnormal   Collection Time: 09/10/20  4:31 PM   Specimen: Cervix  Result Value Ref Range   Yeast Wet Prep HPF POC NONE SEEN NONE SEEN   Trich, Wet Prep NONE SEEN NONE SEEN   Clue Cells Wet Prep HPF POC NONE SEEN NONE SEEN   WBC, Wet Prep HPF POC MANY (A) NONE SEEN   Sperm NONE SEEN    US OB LESS THAN 14 WEEKS WITH OB TRANSVAGINAL  Result Date: 09/10/2020 CLINICAL DATA:  Vaginal spotting. EXAM: OBSTETRIC <14 WK [place] AND TRANSVAGINAL OB US TECHNIQUE: Both transabdominal and transvaginal ultrasound examinations were performed for complete evaluation of the gestation as well as the maternal uterus, adnexal regions, and pelvic cul-de-sac. Transvaginal technique was performed to assess early pregnancy. COMPARISON:  May 20, 2018 FINDINGS: Intrauterine gestational sac: None Yolk sac:  Not Visualized. Embryo:  Not Visualized. Cardiac Activity: Not Visualized. Heart Rate: N/A  bpm Subchorionic hemorrhage:  None visualized. Maternal uterus/adnexae: The proximal to midportion of the endometrium measures 10.8 mm in thickness. The endometrial thickness near the fundus measures 4.4 mm in thickness. No abnormal flow is seen within the endometrium on color Doppler evaluation. The right ovary measures 2.5 cm x 2.0 cm x 2.0 cm and is normal in appearance. The left ovary measures 3.5 cm x 2.2 cm x 2.0 cm and is normal in appearance. No pelvic free fluid is identified. IMPRESSION: 1. Short segment of thickened endometrium, without evidence of an intrauterine pregnancy. Electronically Signed   By: Aram Candela M.D.   On: 09/10/2020 17:12    MDM UA, UPT CBC, HCG ABO/Rh- A Pos Wet prep and gc/chlamydia US OB Comp Less 14 weeks with Transvaginal  Discussed with client the diagnosis of pregnancy of unknown anatomic  location.  Three possibilities of outcome are: a healthy pregnancy that is too early to see a yolk sac to confirm the pregnancy is in the uterus, a pregnancy that is not healthy and has not developed and will not develop, and an ectopic pregnancy that is in the abdomen that cannot be identified at this time.  And ectopic pregnancy can be a life threatening situation as a pregnancy needs to be in the uterus which is a muscle and can stretch to accommodate the growth of a pregnancy.  Other structures in the pelvis and abdomen as not muscular and do not stretch with the growth of a pregnancy.  Worst case scenario is that a structure ruptures with a growing pregnancy not in the uterus and and internal hemorrhage can be a life threatening situation.  We need to follow the progression of this pregnancy carefully.  We need to check another serum pregnancy hormone level to determine if the levels are rising appropriately  and to determine the next steps that are needed for you. Patient's questions were answered.  Patient strongly desires to follow up with [facility], but has not been seen there for this pregnancy. Dr. Timothy Lasso called with report and patient request to follow up with her, will schedule repeat HCG on 3/9. Message left with after hours mailbox.   Assessment and Plan   1. Pregnancy of unknown anatomic location   2. Spotting affecting pregnancy in first trimester    -Discharge home in stable condition -Strict ectopic precautions discussed -Patient advised to follow-up with OB on 3/9 for repeat HCG -Patient may return to MAU as needed  or if her condition were to change or worsen   Rolm BookbinderCaroline M Taylorann Tkach CNM 09/10/2020, 4:21 PM

## 2020-09-10 NOTE — Discharge Instructions (Signed)

## 2020-09-11 LAB — GC/CHLAMYDIA PROBE AMP (~~LOC~~) NOT AT ARMC
Chlamydia: NEGATIVE
Comment: NEGATIVE
Comment: NORMAL
Neisseria Gonorrhea: NEGATIVE

## 2021-02-02 LAB — OB RESULTS CONSOLE GC/CHLAMYDIA
Chlamydia: NEGATIVE
Gonorrhea: NEGATIVE

## 2021-03-02 LAB — HEPATITIS C ANTIBODY: HCV Ab: NEGATIVE

## 2021-03-02 LAB — OB RESULTS CONSOLE HIV ANTIBODY (ROUTINE TESTING): HIV: NONREACTIVE

## 2021-03-02 LAB — OB RESULTS CONSOLE RUBELLA ANTIBODY, IGM: Rubella: IMMUNE

## 2021-03-02 LAB — OB RESULTS CONSOLE ABO/RH: RH Type: POSITIVE

## 2021-03-02 LAB — OB RESULTS CONSOLE RPR: RPR: NONREACTIVE

## 2021-03-02 LAB — OB RESULTS CONSOLE ANTIBODY SCREEN: Antibody Screen: NEGATIVE

## 2021-03-02 LAB — OB RESULTS CONSOLE HEPATITIS B SURFACE ANTIGEN: Hepatitis B Surface Ag: NEGATIVE

## 2021-06-28 IMAGING — US US OB < 14 WEEKS - US OB TV
1 series · 15 of 28 positions shown · non-contrast
Comparison: May 20, 2018

CLINICAL DATA: Vaginal spotting.

EXAM:
OBSTETRIC <14 WK US AND TRANSVAGINAL OB US
TECHNIQUE: Both transabdominal and transvaginal ultrasound examinations were
performed for complete evaluation of the gestation as well as the
maternal uterus, adnexal regions, and pelvic cul-de-sac.
Transvaginal technique was performed to assess early pregnancy.

[Series 1: us ob < 14 weeks - us ob tv · 50 acquisitions, 15 frames shown]
[im 1/50]
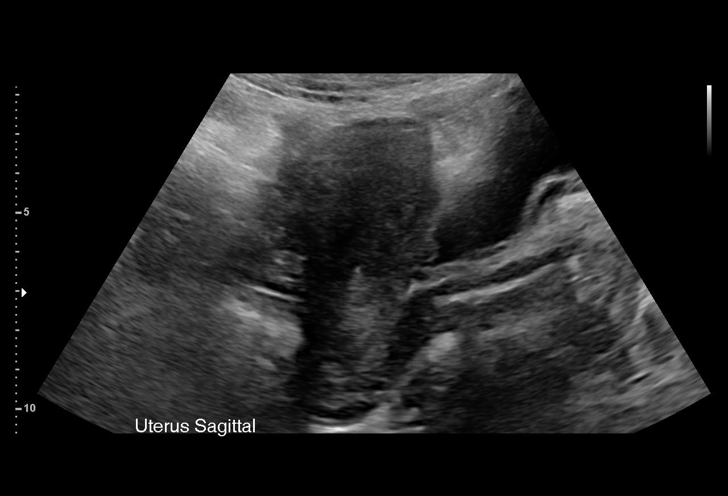
[im 4/50]
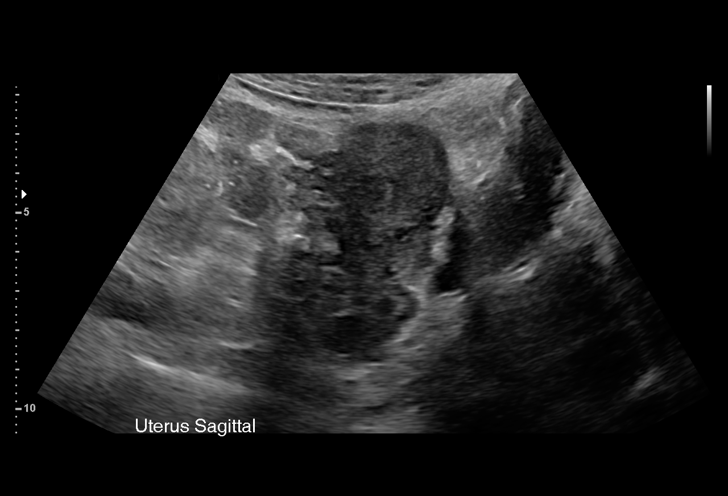
[im 8/50]
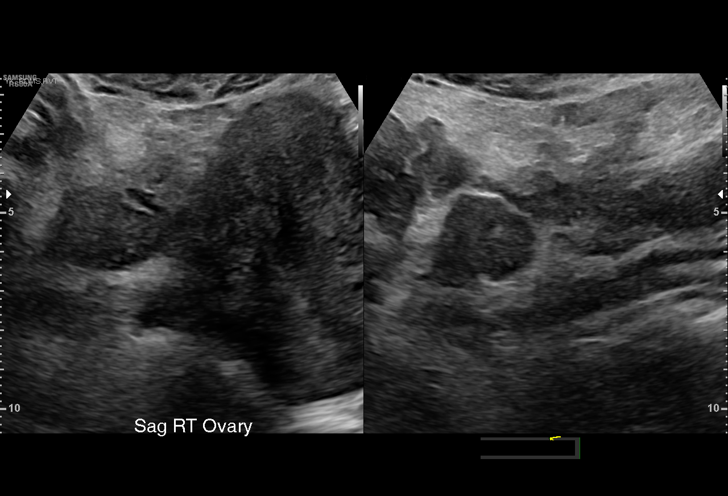
[im 11/50]
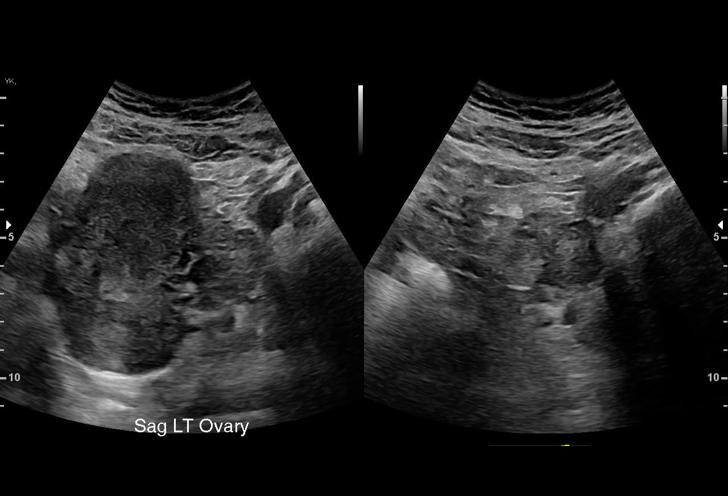
[im 15/50]
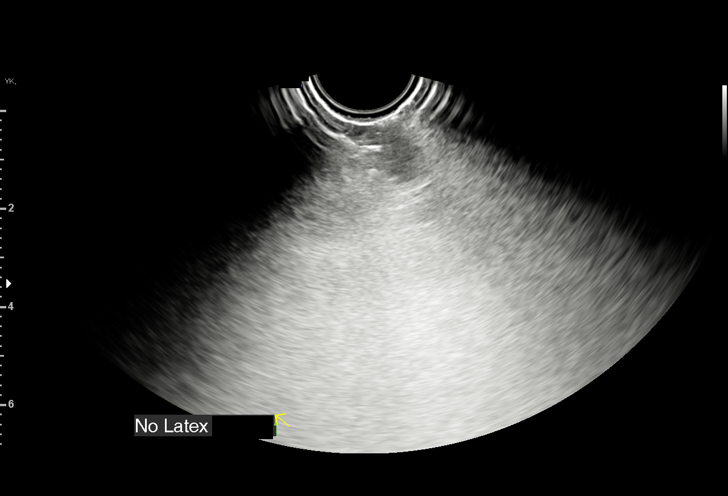
[im 19/50]
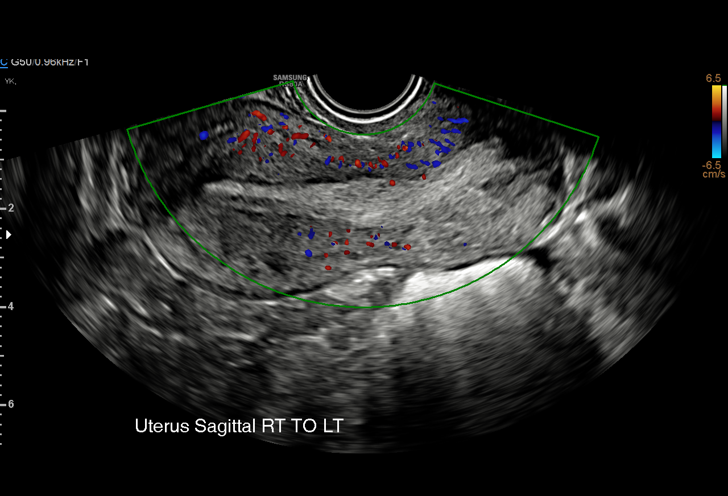
[im 22/50]
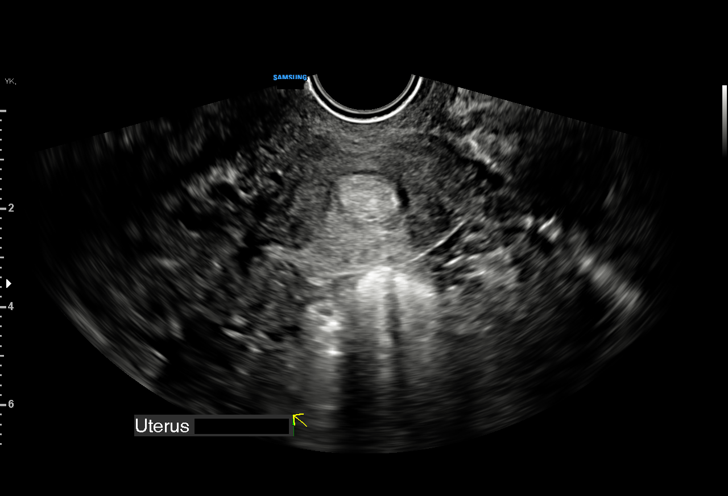
[im 26/50]
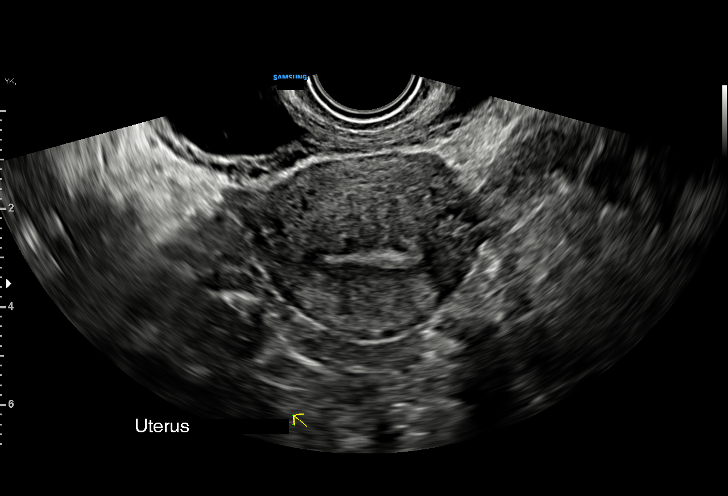
[im 28/50]
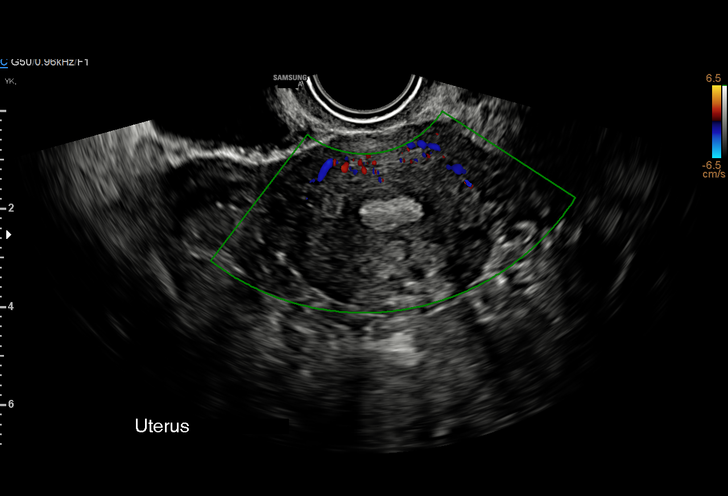
[im 31/50]
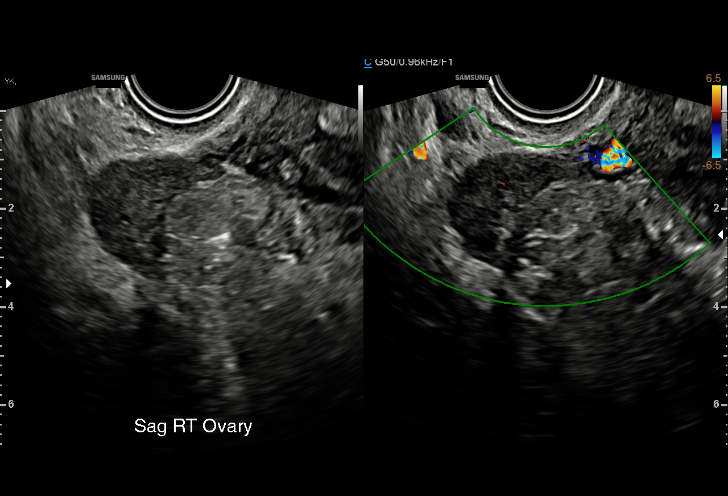
[im 35/50]
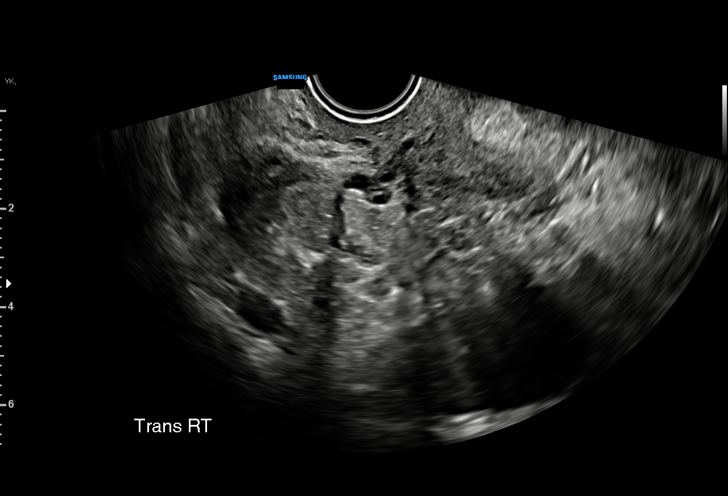
[im 39/50]
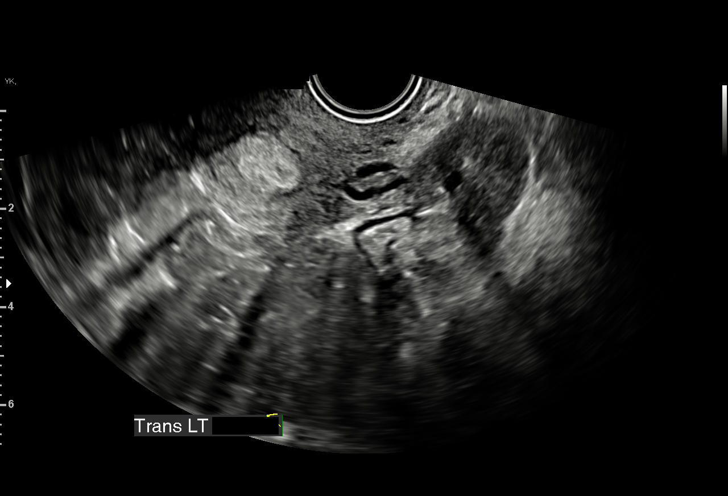
[im 42/50]
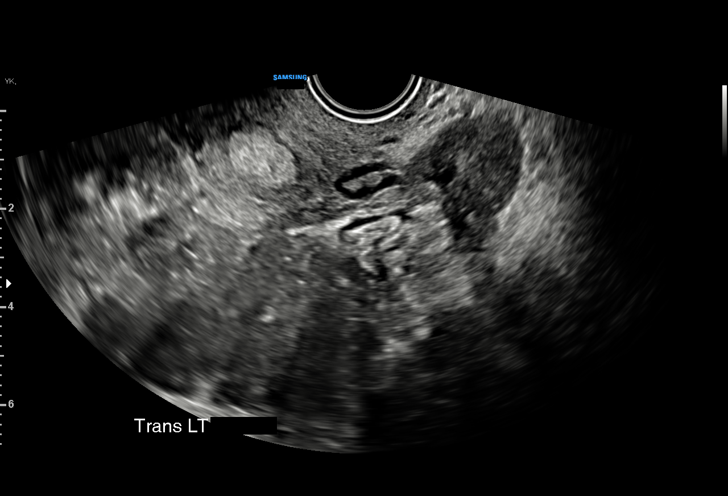
[im 46/50]
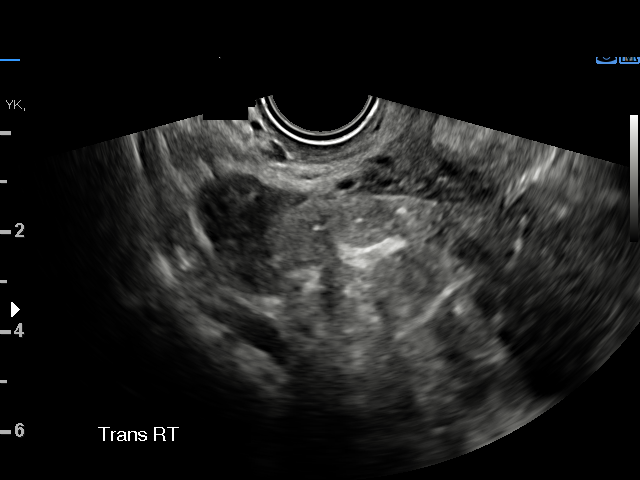
[im 50/50]
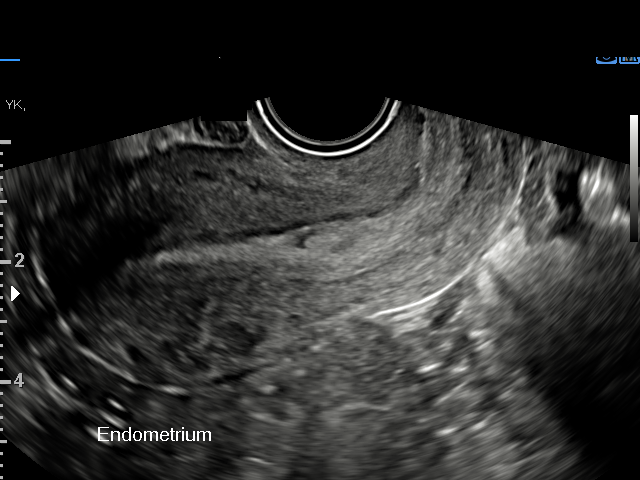

[15 of 28 positions shown; findings below may reference images not displayed]

FINDINGS: Intrauterine gestational sac: None

Yolk sac:  Not Visualized.

Embryo:  Not Visualized.

Cardiac Activity: Not Visualized.

Heart Rate: N/A  bpm

Subchorionic hemorrhage:  None visualized.

Maternal uterus/adnexae: The proximal to midportion of the
endometrium measures 10.8 mm in thickness. The endometrial thickness
near the fundus measures 4.4 mm in thickness. No abnormal flow is
seen within the endometrium on color Doppler evaluation.

The right ovary measures 2.5 cm x 2.0 cm x 2.0 cm and is normal in
appearance.

The left ovary measures 3.5 cm x 2.2 cm x 2.0 cm and is normal in
appearance.

No pelvic free fluid is identified.
IMPRESSION: 1. Short segment of thickened endometrium, without evidence of an
intrauterine pregnancy.

## 2021-07-08 NOTE — L&D Delivery Note (Signed)
Delivery Note Following amniotomy, patient progressed rapidly to complete.  She pushed well with one contraction.  There was a nuchal cord x 1 that was not reduced prior to delivery.  At 5:45 PM a viable female was delivered via Vaginal, Spontaneous (Presentation: Right Occiput Anterior).  APGAR: 8, 9; weight 2710 gm (5 lb 15.6 oz) .   Placenta status: Spontaneous, Intact.  Cord: 3 vessels with the following complications: None.  Cord pH: n/a  Anesthesia: Epidural Episiotomy: None Lacerations:  Second degree with slight extension to inner left labia Suture Repair: 3.0 vicryl rapide Est. Blood Loss (mL):  200 mL  NICU team was requested for delivery given late preterm infant and prolonged exposure to magnesium sulfate.  By completion of repair, baby was skin to skin with mom. Mom to postpartum.  Baby to Couplet care / Skin to Skin.  Peder Allums GEFFEL Montarius Kitagawa 08/17/2021, 6:11 PM

## 2021-08-08 ENCOUNTER — Inpatient Hospital Stay (HOSPITAL_COMMUNITY)
Admission: AD | Admit: 2021-08-08 | Discharge: 2021-08-08 | Disposition: A | Discharge status: Home / Self Care | Payer: 59 | Attending: Obstetrics and Gynecology | Admitting: Obstetrics and Gynecology

## 2021-08-08 ENCOUNTER — Encounter (HOSPITAL_COMMUNITY): Payer: Self-pay | Admitting: Obstetrics and Gynecology

## 2021-08-08 ENCOUNTER — Other Ambulatory Visit: Payer: Self-pay

## 2021-08-08 DIAGNOSIS — Z3689 Encounter for other specified antenatal screening: Secondary | ICD-10-CM

## 2021-08-08 DIAGNOSIS — O368130 Decreased fetal movements, third trimester, not applicable or unspecified: Secondary | ICD-10-CM | POA: Insufficient documentation

## 2021-08-08 DIAGNOSIS — Z3A34 34 weeks gestation of pregnancy: Secondary | ICD-10-CM

## 2021-08-08 DIAGNOSIS — R112 Nausea with vomiting, unspecified: Secondary | ICD-10-CM

## 2021-08-08 DIAGNOSIS — O26893 Other specified pregnancy related conditions, third trimester: Secondary | ICD-10-CM | POA: Diagnosis not present

## 2021-08-08 DIAGNOSIS — O212 Late vomiting of pregnancy: Secondary | ICD-10-CM | POA: Insufficient documentation

## 2021-08-08 DIAGNOSIS — Z880 Allergy status to penicillin: Secondary | ICD-10-CM | POA: Insufficient documentation

## 2021-08-08 DIAGNOSIS — R197 Diarrhea, unspecified: Secondary | ICD-10-CM

## 2021-08-08 LAB — COMPREHENSIVE METABOLIC PANEL
ALT: 15 U/L (ref 0–44)
AST: 20 U/L (ref 15–41)
Albumin: 2.8 g/dL — ABNORMAL LOW (ref 3.5–5.0)
Alkaline Phosphatase: 138 U/L — ABNORMAL HIGH (ref 38–126)
Anion gap: 12 (ref 5–15)
BUN: 9 mg/dL (ref 6–20)
CO2: 19 mmol/L — ABNORMAL LOW (ref 22–32)
Calcium: 8.6 mg/dL — ABNORMAL LOW (ref 8.9–10.3)
Chloride: 105 mmol/L (ref 98–111)
Creatinine, Ser: 0.76 mg/dL (ref 0.44–1.00)
GFR, Estimated: >60 mL/min (ref >60)
Glucose, Bld: 88 mg/dL (ref 70–99)
Potassium: 3.8 mmol/L (ref 3.5–5.1)
Sodium: 136 mmol/L (ref 135–145)
Total Bilirubin: 0.6 mg/dL (ref 0.3–1.2)
Total Protein: 6.1 g/dL — ABNORMAL LOW (ref 6.5–8.1)

## 2021-08-08 LAB — URINALYSIS, ROUTINE W REFLEX MICROSCOPIC
Glucose, UA: NEGATIVE mg/dL
Hgb urine dipstick: NEGATIVE
Ketones, ur: 40 mg/dL — AB
Nitrite: NEGATIVE
Protein, ur: NEGATIVE mg/dL
Specific Gravity, Urine: >1.03 — ABNORMAL HIGH (ref 1.005–1.030)
pH: 6 (ref 5.0–8.0)

## 2021-08-08 LAB — CBC
HCT: 40.1 % (ref 36.0–46.0)
Hemoglobin: 14.2 g/dL (ref 12.0–15.0)
MCH: 31.3 pg (ref 26.0–34.0)
MCHC: 35.4 g/dL (ref 30.0–36.0)
MCV: 88.5 fL (ref 80.0–100.0)
Platelets: 159 10*3/uL (ref 150–400)
RBC: 4.53 MIL/uL (ref 3.87–5.11)
RDW: 13.3 % (ref 11.5–15.5)
WBC: 8.7 10*3/uL (ref 4.0–10.5)
nRBC: 0 % (ref 0.0–0.2)

## 2021-08-08 LAB — URINALYSIS, MICROSCOPIC (REFLEX)

## 2021-08-08 MED ORDER — ONDANSETRON 4 MG PO TBDP: 4.0000 mg | ORAL_TABLET | Freq: Three times a day (TID) | ORAL | 0 refills | Status: AC | PRN

## 2021-08-08 MED ORDER — ONDANSETRON HCL 4 MG/2ML IJ SOLN
4.0000 mg | Freq: Once | INTRAMUSCULAR | Status: AC
  Administered 2021-08-08: 4 mg via INTRAVENOUS
  Filled 2021-08-08: qty 2

## 2021-08-08 MED ORDER — LACTATED RINGERS IV BOLUS
1000.0000 mL | Freq: Once | INTRAVENOUS | Status: AC
  Administered 2021-08-08: 1000 mL via INTRAVENOUS

## 2021-08-08 MED ORDER — LACTATED RINGERS IV BOLUS: 1000.0000 mL | Freq: Once | INTRAVENOUS | Status: DC

## 2021-08-08 MED ORDER — PROMETHAZINE HCL 25 MG/ML IJ SOLN
25.0000 mg | Freq: Once | INTRAVENOUS | Status: AC
  Administered 2021-08-08: 25 mg via INTRAVENOUS
  Filled 2021-08-08: qty 1

## 2021-08-08 MED ORDER — M.V.I. ADULT IV INJ
Freq: Once | INTRAVENOUS | Status: AC
  Filled 2021-08-08: qty 1000

## 2021-08-08 NOTE — MAU Note (Signed)
.  Christy Zuniga is a 31 y.o. at [redacted]w[redacted]d here in MAU reporting: started having n/v/d last night around 2100. States she has not been around anyone that has been sick. Has had x6 episodes of diarrhea and has thrown up every hour since midnight. Denies VB or LOF. States DFM today, but has felt some movement. Also having lower back pain. Last took tylenol at 0000.

## 2021-08-08 NOTE — MAU Provider Note (Signed)
History     CSN: [id number]  Arrival date and time: 08/08/21 1049   Event Date/Time   First Provider Initiated Contact with Patient 08/08/21 1158      Chief Complaint  Patient presents with   Back Pain   Nausea   Emesis   Diarrhea   Ms. Christy Zuniga is a 31 y.o. year old [id number] female at 3785w3d weeks gestation who presents to MAU reporting N/V/D around 2100 last night. She has had no sick contacts. She has had 6 episodes of diarrhea and vomited every hour since midnight. She last vomited at 0730. She also reports DFM today, but still feeling movement. She has lower back pain and tried Tylenol around midnight. She last drank H2O and Gatorade at 1000. She reports drinking water to "help with with nausea." [facility]; next appt on Friday 08/10/2021. Her spouse is present and contributing to the history taking.   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     Family History  Problem Relation Age of Onset   Cancer Mother    Depression Father    Heart disease Maternal Grandfather     Social History   Tobacco Use   Smoking status: Never   Smokeless tobacco: Never  Vaping Use   Vaping Use: Never used  Substance Use Topics   Alcohol use: Not Currently    Alcohol/week: 1.0 standard drink    Types: 1 Cans of beer per week    Comment: Occasionally   Drug use: Never    Allergies:  Allergies  Allergen Reactions   Penicillins Hives    Has patient had a PCN reaction causing immediate rash, facial/tongue/throat swelling, SOB or lightheadedness with hypotension: No Has patient had a PCN reaction causing  severe rash involving mucus membranes or skin necrosis: No Has patient had a PCN reaction that required hospitalization: No Has patient had a PCN reaction occurring within the last 10 years: No If all of the above answers are "NO", then may proceed with Cephalosporin use.    Dye Fdc Red [Red Dye] Hives    Actually Pink Dye    Medications Prior to Admission  Medication Sig Dispense Refill Last Dose   Prenatal Vit-Fe Fumarate-FA (MULTIVITAMIN-PRENATAL) 27-0.8 MG TABS tablet Take 1 tablet by mouth daily at 12 noon.       Review of Systems  Constitutional:  Positive for appetite change.  HENT: Negative.    Eyes: Negative.   Respiratory: Negative.    Cardiovascular: Negative.   Gastrointestinal:  Positive for diarrhea (6 episodes since last night), nausea and vomiting (every hours since midnight).  Endocrine: Negative.   Genitourinary: Negative.   Musculoskeletal:  Positive for back pain.  Skin: Negative.   Allergic/Immunologic: Negative.   Neurological: Negative.   Hematological: Negative.   Psychiatric/Behavioral: Negative.    Physical Exam   Blood pressure 115/90, pulse (!) 116,  temperature 99 F (37.2 C), temperature source Oral, resp. rate 15, SpO2 97 %, unknown if currently breastfeeding.  Physical Exam Vitals and nursing note reviewed.  Constitutional:      Appearance: Normal appearance. She is normal weight.  Cardiovascular:     Rate and Rhythm: Normal rate and regular rhythm.     Pulses: Normal pulses.     Heart sounds: Normal heart sounds.  Pulmonary:     Effort: Pulmonary effort is normal.     Breath sounds: Normal breath sounds.  Abdominal:     General: Bowel sounds are decreased.     Palpations: Abdomen is soft.  Genitourinary:    Comments: Dilation: 1 Effacement (%): 50 Station: Ballotable Presentation: Vertex Exam by: Raelyn Mora, CNM Musculoskeletal:        General: Normal range of motion.  Skin:    General: Skin is warm and dry.  Neurological:      Mental Status: She is alert and oriented to person, place, and time.  Psychiatric:        Mood and Affect: Mood normal.        Behavior: Behavior normal.        Thought Content: Thought content normal.        Judgment: Judgment normal.   REACTIVE NST - FHR: 140 bpm / moderate variability / accels present / decels absent / TOCO: every 4-6 mins, decreased to occasionally after 3 liters of fluids  MAU Course  Procedures  MDM IVFs: Phenergan 25 mg in LR 1000 ml @ 999 ml/hr; followed by MVI in LR 1000 ml @ 999 ml/hr -- relieved nausea/vomiting Pepcid 20 mg IVPB LR 1000 ml bolus PO Challenge -- patient tolerated well  Results for orders placed or performed during the hospital encounter of 08/08/21 (from the past 24 hour(s))  Urinalysis, Routine w reflex microscopic Urine, Clean Catch     Status: Abnormal   Collection Time: 08/08/21 11:25 AM  Result Value Ref Range   Color, Urine YELLOW YELLOW   APPearance CLEAR CLEAR   Specific Gravity, Urine >1.030 (H) 1.005 - 1.030   pH 6.0 5.0 - 8.0   Glucose, UA NEGATIVE NEGATIVE mg/dL   Hgb urine dipstick NEGATIVE NEGATIVE   Bilirubin Urine SMALL (A) NEGATIVE   Ketones, ur 40 (A) NEGATIVE mg/dL   Protein, ur NEGATIVE NEGATIVE mg/dL   Nitrite NEGATIVE NEGATIVE   Leukocytes,Ua TRACE (A) NEGATIVE  Urinalysis, Microscopic (reflex)     Status: Abnormal   Collection Time: 08/08/21 11:25 AM  Result Value Ref Range   RBC / HPF 0-5 0 - 5 RBC/hpf   WBC, UA 11-20 0 - 5 WBC/hpf   Bacteria, UA RARE (A) NONE SEEN   Squamous Epithelial / LPF 6-10 0 - 5   Non Squamous Epithelial PRESENT (A) NONE SEEN   Mucus PRESENT   CBC     Status: None   Collection Time: 08/08/21 12:28 PM  Result Value Ref Range   WBC 8.7 4.0 - 10.5 K/uL   RBC 4.53 3.87 - 5.11 MIL/uL   Hemoglobin 14.2 12.0 - 15.0 g/dL   HCT 41.9 62.2 - 29.7 %   MCV 88.5 80.0 - 100.0 fL   MCH 31.3 26.0 - 34.0 pg   MCHC 35.4 30.0 - 36.0 g/dL   RDW 98.9 21.1 - 94.1 %   Platelets 159 150 - 400  K/uL   nRBC 0.0 0.0 - 0.2 %  Comprehensive metabolic panel     Status: Abnormal   Collection  Time: 08/08/21 12:28 PM  Result Value Ref Range   Sodium 136 135 - 145 mmol/L   Potassium 3.8 3.5 - 5.1 mmol/L   Chloride 105 98 - 111 mmol/L   CO2 19 (L) 22 - 32 mmol/L   Glucose, Bld 88 70 - 99 mg/dL   BUN 9 6 - 20 mg/dL   Creatinine, Ser 0.08 0.44 - 1.00 mg/dL   Calcium 8.6 (L) 8.9 - 10.3 mg/dL   Total Protein 6.1 (L) 6.5 - 8.1 g/dL   Albumin 2.8 (L) 3.5 - 5.0 g/dL   AST 20 15 - 41 U/L   ALT 15 0 - 44 U/L   Alkaline Phosphatase 138 (H) 38 - 126 U/L   Total Bilirubin 0.6 0.3 - 1.2 mg/dL   GFR, Estimated >67 >61 mL/min   Anion gap 12 5 - 15    Assessment and Plan  Nausea, vomiting, and diarrhea  - Rx for Zofran 4 mg ODT every 8 hours prn N/V/D - Information provided on Norovirus and viral gastroenteritis  - Advised to stay well-hydrated  NST (non-stress test) reactive - Reassurance given that baby's well-being is good on NST - Patient and spouse shown areas of reactivity on NST tracing  [redacted] weeks gestation of pregnancy  - Discharge patient - Keep scheduled appt with [facility] on Friday 08/10/2021 - Patient verbalized an understanding of the plan of care and agrees.    Raelyn Mora, CNM 08/08/2021, 11:58 AM

## 2021-08-08 NOTE — Discharge Instructions (Signed)
Please stay well hydrated with at least 8-10 bottle of water every day.

## 2021-08-14 ENCOUNTER — Inpatient Hospital Stay (HOSPITAL_COMMUNITY)
Admission: AD | Admit: 2021-08-14 | Discharge: 2021-08-19 | DRG: 806 | Disposition: A | Discharge status: Home / Self Care | Payer: 59 | Attending: Obstetrics | Admitting: Obstetrics

## 2021-08-14 DIAGNOSIS — Z20822 Contact with and (suspected) exposure to covid-19: Secondary | ICD-10-CM | POA: Diagnosis present

## 2021-08-14 DIAGNOSIS — O9912 Other diseases of the blood and blood-forming organs and certain disorders involving the immune mechanism complicating childbirth: Secondary | ICD-10-CM | POA: Diagnosis present

## 2021-08-14 DIAGNOSIS — O163 Unspecified maternal hypertension, third trimester: Secondary | ICD-10-CM

## 2021-08-14 DIAGNOSIS — L299 Pruritus, unspecified: Secondary | ICD-10-CM

## 2021-08-14 DIAGNOSIS — R1011 Right upper quadrant pain: Secondary | ICD-10-CM

## 2021-08-14 DIAGNOSIS — O1420 HELLP syndrome (HELLP), unspecified trimester: Secondary | ICD-10-CM | POA: Diagnosis present

## 2021-08-14 DIAGNOSIS — D696 Thrombocytopenia, unspecified: Secondary | ICD-10-CM | POA: Diagnosis present

## 2021-08-14 DIAGNOSIS — Z7982 Long term (current) use of aspirin: Secondary | ICD-10-CM

## 2021-08-14 DIAGNOSIS — Z880 Allergy status to penicillin: Secondary | ICD-10-CM

## 2021-08-14 DIAGNOSIS — O133 Gestational [pregnancy-induced] hypertension without significant proteinuria, third trimester: Secondary | ICD-10-CM

## 2021-08-14 DIAGNOSIS — O1424 HELLP syndrome, complicating childbirth: Principal | ICD-10-CM | POA: Diagnosis present

## 2021-08-14 DIAGNOSIS — O139 Gestational [pregnancy-induced] hypertension without significant proteinuria, unspecified trimester: Secondary | ICD-10-CM | POA: Diagnosis present

## 2021-08-14 DIAGNOSIS — Z3A35 35 weeks gestation of pregnancy: Secondary | ICD-10-CM

## 2021-08-14 HISTORY — DX: Personal history of other diseases of the nervous system and sense organs: Z86.69

## 2021-08-15 ENCOUNTER — Inpatient Hospital Stay (HOSPITAL_BASED_OUTPATIENT_CLINIC_OR_DEPARTMENT_OTHER): Payer: 59

## 2021-08-15 ENCOUNTER — Encounter (HOSPITAL_COMMUNITY): Payer: Self-pay | Admitting: Obstetrics and Gynecology

## 2021-08-15 ENCOUNTER — Inpatient Hospital Stay (HOSPITAL_COMMUNITY): Payer: 59

## 2021-08-15 ENCOUNTER — Other Ambulatory Visit: Payer: Self-pay

## 2021-08-15 DIAGNOSIS — O1423 HELLP syndrome (HELLP), third trimester: Secondary | ICD-10-CM | POA: Diagnosis not present

## 2021-08-15 DIAGNOSIS — O0913 Supervision of pregnancy with history of ectopic or molar pregnancy, third trimester: Secondary | ICD-10-CM

## 2021-08-15 DIAGNOSIS — O1420 HELLP syndrome (HELLP), unspecified trimester: Secondary | ICD-10-CM | POA: Diagnosis present

## 2021-08-15 DIAGNOSIS — Z3A35 35 weeks gestation of pregnancy: Secondary | ICD-10-CM

## 2021-08-15 DIAGNOSIS — O133 Gestational [pregnancy-induced] hypertension without significant proteinuria, third trimester: Secondary | ICD-10-CM | POA: Diagnosis not present

## 2021-08-15 DIAGNOSIS — O163 Unspecified maternal hypertension, third trimester: Secondary | ICD-10-CM

## 2021-08-15 DIAGNOSIS — Z880 Allergy status to penicillin: Secondary | ICD-10-CM | POA: Diagnosis not present

## 2021-08-15 DIAGNOSIS — L299 Pruritus, unspecified: Secondary | ICD-10-CM

## 2021-08-15 DIAGNOSIS — Z20822 Contact with and (suspected) exposure to covid-19: Secondary | ICD-10-CM | POA: Diagnosis present

## 2021-08-15 DIAGNOSIS — O9912 Other diseases of the blood and blood-forming organs and certain disorders involving the immune mechanism complicating childbirth: Secondary | ICD-10-CM | POA: Diagnosis present

## 2021-08-15 DIAGNOSIS — O1413 Severe pre-eclampsia, third trimester: Secondary | ICD-10-CM | POA: Diagnosis not present

## 2021-08-15 DIAGNOSIS — O1424 HELLP syndrome, complicating childbirth: Secondary | ICD-10-CM | POA: Diagnosis present

## 2021-08-15 DIAGNOSIS — Z7982 Long term (current) use of aspirin: Secondary | ICD-10-CM | POA: Diagnosis not present

## 2021-08-15 DIAGNOSIS — O139 Gestational [pregnancy-induced] hypertension without significant proteinuria, unspecified trimester: Secondary | ICD-10-CM | POA: Diagnosis present

## 2021-08-15 DIAGNOSIS — R1011 Right upper quadrant pain: Secondary | ICD-10-CM | POA: Diagnosis present

## 2021-08-15 DIAGNOSIS — D696 Thrombocytopenia, unspecified: Secondary | ICD-10-CM | POA: Diagnosis present

## 2021-08-15 LAB — COMPREHENSIVE METABOLIC PANEL
ALT: 46 U/L — ABNORMAL HIGH (ref 0–44)
ALT: 46 U/L — ABNORMAL HIGH (ref 0–44)
ALT: 49 U/L — ABNORMAL HIGH (ref 0–44)
AST: 45 U/L — ABNORMAL HIGH (ref 15–41)
AST: 50 U/L — ABNORMAL HIGH (ref 15–41)
AST: 50 U/L — ABNORMAL HIGH (ref 15–41)
Albumin: 2.5 g/dL — ABNORMAL LOW (ref 3.5–5.0)
Albumin: 2.3 g/dL — ABNORMAL LOW (ref 3.5–5.0)
Albumin: 2.4 g/dL — ABNORMAL LOW (ref 3.5–5.0)
Alkaline Phosphatase: 151 U/L — ABNORMAL HIGH (ref 38–126)
Alkaline Phosphatase: 145 U/L — ABNORMAL HIGH (ref 38–126)
Alkaline Phosphatase: 161 U/L — ABNORMAL HIGH (ref 38–126)
Anion gap: 9 (ref 5–15)
Anion gap: 8 (ref 5–15)
Anion gap: 10 (ref 5–15)
BUN: 8 mg/dL (ref 6–20)
BUN: 8 mg/dL (ref 6–20)
BUN: 6 mg/dL (ref 6–20)
CO2: 19 mmol/L — ABNORMAL LOW (ref 22–32)
CO2: 21 mmol/L — ABNORMAL LOW (ref 22–32)
CO2: 21 mmol/L — ABNORMAL LOW (ref 22–32)
Calcium: 8.8 mg/dL — ABNORMAL LOW (ref 8.9–10.3)
Calcium: 8.8 mg/dL — ABNORMAL LOW (ref 8.9–10.3)
Calcium: 8 mg/dL — ABNORMAL LOW (ref 8.9–10.3)
Chloride: 108 mmol/L (ref 98–111)
Chloride: 107 mmol/L (ref 98–111)
Chloride: 105 mmol/L (ref 98–111)
Creatinine, Ser: 0.82 mg/dL (ref 0.44–1.00)
Creatinine, Ser: 0.77 mg/dL (ref 0.44–1.00)
Creatinine, Ser: 0.74 mg/dL (ref 0.44–1.00)
GFR, Estimated: >60 mL/min (ref >60)
GFR, Estimated: >60 mL/min (ref >60)
GFR, Estimated: >60 mL/min (ref >60)
Glucose, Bld: 90 mg/dL (ref 70–99)
Glucose, Bld: 86 mg/dL (ref 70–99)
Glucose, Bld: 123 mg/dL — ABNORMAL HIGH (ref 70–99)
Potassium: 3.9 mmol/L (ref 3.5–5.1)
Potassium: 3.7 mmol/L (ref 3.5–5.1)
Potassium: 3.7 mmol/L (ref 3.5–5.1)
Sodium: 136 mmol/L (ref 135–145)
Sodium: 136 mmol/L (ref 135–145)
Sodium: 136 mmol/L (ref 135–145)
Total Bilirubin: 0.6 mg/dL (ref 0.3–1.2)
Total Bilirubin: 0.6 mg/dL (ref 0.3–1.2)
Total Bilirubin: 0.4 mg/dL (ref 0.3–1.2)
Total Protein: 5.4 g/dL — ABNORMAL LOW (ref 6.5–8.1)
Total Protein: 5.1 g/dL — ABNORMAL LOW (ref 6.5–8.1)
Total Protein: 5.3 g/dL — ABNORMAL LOW (ref 6.5–8.1)

## 2021-08-15 LAB — CBC WITH DIFFERENTIAL/PLATELET
Abs Immature Granulocytes: 0.11 10*3/uL — ABNORMAL HIGH (ref 0.00–0.07)
Basophils Absolute: 0 10*3/uL (ref 0.0–0.1)
Basophils Relative: 0 %
Eosinophils Absolute: 0 10*3/uL (ref 0.0–0.5)
Eosinophils Relative: 0 %
HCT: 35.1 % — ABNORMAL LOW (ref 36.0–46.0)
Hemoglobin: 12.5 g/dL (ref 12.0–15.0)
Immature Granulocytes: 1 %
Lymphocytes Relative: 19 %
Lymphs Abs: 1.5 10*3/uL (ref 0.7–4.0)
MCH: 31.4 pg (ref 26.0–34.0)
MCHC: 35.6 g/dL (ref 30.0–36.0)
MCV: 88.2 fL (ref 80.0–100.0)
Monocytes Absolute: 0.4 10*3/uL (ref 0.1–1.0)
Monocytes Relative: 5 %
Neutro Abs: 6 10*3/uL (ref 1.7–7.7)
Neutrophils Relative %: 75 %
Platelets: 117 10*3/uL — ABNORMAL LOW (ref 150–400)
RBC: 3.98 MIL/uL (ref 3.87–5.11)
RDW: 13.2 % (ref 11.5–15.5)
WBC: 8.1 10*3/uL (ref 4.0–10.5)
nRBC: 0 % (ref 0.0–0.2)

## 2021-08-15 LAB — CBC
HCT: 34.8 % — ABNORMAL LOW (ref 36.0–46.0)
HCT: 34.7 % — ABNORMAL LOW (ref 36.0–46.0)
Hemoglobin: 12.6 g/dL (ref 12.0–15.0)
Hemoglobin: 12.5 g/dL (ref 12.0–15.0)
MCH: 31.7 pg (ref 26.0–34.0)
MCH: 31.6 pg (ref 26.0–34.0)
MCHC: 36.2 g/dL — ABNORMAL HIGH (ref 30.0–36.0)
MCHC: 36 g/dL (ref 30.0–36.0)
MCV: 87.7 fL (ref 80.0–100.0)
MCV: 87.6 fL (ref 80.0–100.0)
Platelets: 120 10*3/uL — ABNORMAL LOW (ref 150–400)
Platelets: 115 10*3/uL — ABNORMAL LOW (ref 150–400)
RBC: 3.97 MIL/uL (ref 3.87–5.11)
RBC: 3.96 MIL/uL (ref 3.87–5.11)
RDW: 13.1 % (ref 11.5–15.5)
RDW: 13.2 % (ref 11.5–15.5)
WBC: 11.7 10*3/uL — ABNORMAL HIGH (ref 4.0–10.5)
WBC: 11.4 10*3/uL — ABNORMAL HIGH (ref 4.0–10.5)
nRBC: 0 % (ref 0.0–0.2)
nRBC: 0 % (ref 0.0–0.2)

## 2021-08-15 LAB — RESP PANEL BY RT-PCR (FLU A&B, COVID) ARPGX2
Influenza A by PCR: NEGATIVE
Influenza B by PCR: NEGATIVE
SARS Coronavirus 2 by RT PCR: NEGATIVE

## 2021-08-15 LAB — TYPE AND SCREEN
ABO/RH(D): A POS
Antibody Screen: NEGATIVE

## 2021-08-15 LAB — PROTEIN / CREATININE RATIO, URINE
Creatinine, Urine: 82.94 mg/dL
Creatinine, Urine: 52.5 mg/dL
Protein Creatinine Ratio: 0.12 mg/mg{Cre} (ref 0.00–0.15)
Protein Creatinine Ratio: 0.13 mg/mg{Cre} (ref 0.00–0.15)
Total Protein, Urine: 10 mg/dL
Total Protein, Urine: 7 mg/dL

## 2021-08-15 LAB — AMYLASE: Amylase: 49 U/L (ref 28–100)

## 2021-08-15 LAB — LIPASE, BLOOD: Lipase: 45 U/L (ref 11–51)

## 2021-08-15 MED ORDER — SODIUM CHLORIDE 0.9 % IV SOLN: 2.0000 g | Freq: Four times a day (QID) | INTRAVENOUS | Status: DC

## 2021-08-15 MED ORDER — MAGNESIUM SULFATE BOLUS VIA INFUSION
6.0000 g | Freq: Once | INTRAVENOUS | Status: AC
  Administered 2021-08-15: 6 g via INTRAVENOUS
  Filled 2021-08-15: qty 1000

## 2021-08-15 MED ORDER — AZITHROMYCIN 250 MG PO TABS: 1000.0000 mg | ORAL_TABLET | Freq: Once | ORAL | Status: DC

## 2021-08-15 MED ORDER — ACETAMINOPHEN 325 MG PO TABS
650.0000 mg | ORAL_TABLET | ORAL | Status: DC | PRN
  Administered 2021-08-15: 650 mg via ORAL
  Filled 2021-08-15: qty 2

## 2021-08-15 MED ORDER — AMOXICILLIN 500 MG PO CAPS: 500.0000 mg | ORAL_CAPSULE | Freq: Three times a day (TID) | ORAL | Status: DC

## 2021-08-15 MED ORDER — ACETAMINOPHEN 325 MG PO TABS: 650.0000 mg | ORAL_TABLET | ORAL | Status: DC | PRN

## 2021-08-15 MED ORDER — LACTATED RINGERS IV SOLN: INTRAVENOUS | Status: DC

## 2021-08-15 MED ORDER — CYCLOBENZAPRINE HCL 5 MG PO TABS
5.0000 mg | ORAL_TABLET | Freq: Once | ORAL | Status: AC
  Administered 2021-08-15: 5 mg via ORAL
  Filled 2021-08-15: qty 1

## 2021-08-15 MED ORDER — CALCIUM CARBONATE ANTACID 500 MG PO CHEW: 2.0000 | CHEWABLE_TABLET | ORAL | Status: DC | PRN

## 2021-08-15 MED ORDER — PRENATAL MULTIVITAMIN CH
1.0000 | ORAL_TABLET | Freq: Every day | ORAL | Status: DC
  Administered 2021-08-15 – 2021-08-16 (×2): 1 via ORAL
  Filled 2021-08-15 (×2): qty 1

## 2021-08-15 MED ORDER — MAGNESIUM SULFATE 40 GM/1000ML IV SOLN
1.0000 g/h | INTRAVENOUS | Status: AC
  Administered 2021-08-16 – 2021-08-17 (×2): 1 g/h via INTRAVENOUS
  Filled 2021-08-15 (×4): qty 1000

## 2021-08-15 MED ORDER — BETAMETHASONE SOD PHOS & ACET 6 (3-3) MG/ML IJ SUSP
12.0000 mg | INTRAMUSCULAR | Status: AC
  Administered 2021-08-15 – 2021-08-16 (×2): 12 mg via INTRAMUSCULAR
  Filled 2021-08-15: qty 5

## 2021-08-15 NOTE — H&P (Signed)
Chief Complaint:  Abdominal Pain   Event Date/Time   First Provider Initiated Contact with Patient 08/15/21 0031     HPI: Raliyah Tavakoli is a 31 y.o. [id number] at 66w3dwho presents to maternity admissions reporting pain in upper right abdomen, wrapping around to the back.  Started tonight.  Also having edema of hands and feet and itching . She reports good fetal movement, denies LOF, vaginal bleeding, vaginal itching/burning, urinary symptoms, h/a, dizziness, n/v, diarrhea, constipation or fever/chills.  She denies headache, visual changes  Abdominal Pain This is a new problem. The current episode started today. The problem occurs constantly. The problem has been unchanged. The pain is located in the RUQ. The quality of the pain is aching and cramping. The abdominal pain does not radiate. Pertinent negatives include no fever, headaches or nausea. The pain is relieved by Nothing. She has tried acetaminophen for the symptoms. The treatment provided mild relief.   RN Note: Awoke Tues am at 0430 with pain in RUQ. Pain radiates around side and to back. Dull consistent pain that does not go away. Tylenol helped but did not take it away. Feels like I pulled a muscle in R scapula and unsure if related. Also hands and feet very itch since Friday. Denies VB or LOF. Good FM. 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,71mnths congenatal nevuss N/A     Family  History: Family History  Problem Relation Age of Onset   Cancer Mother    Depression Father    Heart disease Maternal Grandfather     Social History: Social History   Tobacco Use   Smoking status: Never   Smokeless tobacco: Never  Vaping Use   Vaping Use: Never used  Substance Use Topics   Alcohol use: Not Currently    Alcohol/week: 1.0 standard drink    Types: 1 Cans of beer per week    Comment: Occasionally   Drug use: Never    Allergies:  Allergies  Allergen Reactions   Penicillins Hives    Has patient had a PCN reaction causing immediate rash, facial/tongue/throat swelling, SOB or lightheadedness with hypotension: No Has patient had a PCN reaction causing severe rash involving mucus membranes or skin necrosis: No Has patient had a PCN reaction that required hospitalization: No Has patient had a PCN reaction occurring within the last 10 years: No If all of the above answers are "NO", then may proceed with Cephalosporin use.    Dye Fdc Red [Red Dye] Hives    Actually Pink Dye    Meds:  Medications Prior to Admission  Medication Sig Dispense Refill Last Dose   aspirin 81 MG chewable tablet Chew by mouth daily.   08/15/2021   ondansetron (ZOFRAN-ODT) 4 MG disintegrating tablet Take 1  tablet (4 mg total) by mouth every 8 (eight) hours as needed for nausea or vomiting. 15 tablet 0 Past Month   Prenatal Vit-Fe Fumarate-FA (MULTIVITAMIN-PRENATAL) 27-0.8 MG TABS tablet Take 1 tablet by mouth daily at 12 noon.   08/15/2021    I have reviewed patient's Past Medical Hx, Surgical Hx, Family Hx, Social Hx, medications and allergies.   ROS:  Review of Systems  Constitutional:  Negative for fever.  Gastrointestinal:  Positive for abdominal pain. Negative for nausea.  Neurological:  Negative for headaches.  Other systems negative  Physical Exam  Patient Vitals for the past 24 hrs:  BP Temp Pulse Resp SpO2 Height Weight  08/15/21 0231 137/90 -- (!) 50 -- -- -- --  08/15/21  0224 140/76 -- (!) 50 -- -- -- --  08/15/21 0115 138/86 -- 61 -- 99 % -- --  08/15/21 0100 (!) 158/88 -- (!) 54 -- 99 % -- --  08/15/21 0045 129/84 -- 64 -- 99 % -- --  08/15/21 0040 (!) 144/79 -- 60 -- 100 % -- --  08/15/21 0012 (!) 142/91 -- -- -- -- -- --  08/15/21 0008 -- 97.9 F (36.6 C) 61 16 100 % 5\' 3"  (1.6 m) 85.7 kg    Vitals:   08/15/21 0100 08/15/21 0115 08/15/21 0224 08/15/21 0231  BP: (!) 158/88 138/86 140/76 137/90  Pulse: (!) 54 61 (!) 50 (!) 50  Resp:      Temp:      SpO2: 99% 99%    Weight:      Height:        Constitutional: Well-developed, well-nourished female in no acute distress.  Cardiovascular: normal rate and rhythm Respiratory: normal effort, clear to auscultation bilaterally GI: Abd soft, exquisitely tender over RUQ,  abdomen is gravid and appropriate for gestational age.   No rebound or guarding. MS: Extremities nontender, no edema, normal ROM Neurologic: Alert and oriented x 4.   DTRs 3+ with no clonus GU: Neg CVAT.  FHT:  Baseline 120-130 , moderate variability, accelerations present, no decelerations Contractions:  Irregular    Dilation: 1.5 Effacement (%): 70 Station: Ballotable Presentation: Vertex Exam by:: [email or web address], CNM    Labs: Results for orders placed or performed during the hospital encounter of 08/14/21 (from the past 24 hour(s))  CBC     Status: Abnormal   Collection Time: 08/15/21  1:01 AM  Result Value Ref Range   WBC 11.7 (H) 4.0 - 10.5 K/uL   RBC 3.97 3.87 - 5.11 MIL/uL   Hemoglobin 12.6 12.0 - 15.0 g/dL   HCT 10/13/21 (L) 84.1 - 32.4 %   MCV 87.7 80.0 - 100.0 fL   MCH 31.7 26.0 - 34.0 pg   MCHC 36.2 (H) 30.0 - 36.0 g/dL   RDW 40.1 02.7 - 25.3 %   Platelets 120 (L) 150 - 400 K/uL   nRBC 0.0 0.0 - 0.2 %  Comprehensive metabolic panel     Status: Abnormal   Collection Time: 08/15/21  1:01 AM  Result Value Ref Range   Sodium 136 135 - 145 mmol/L   Potassium 3.9 3.5 - 5.1 mmol/L   Chloride 108 98 - 111 mmol/L    CO2 19 (L) 22 - 32 mmol/L   Glucose, Bld 90 70 - 99 mg/dL   BUN 8 6 - 20 mg/dL   Creatinine, Ser 10/13/21 0.44 - 1.00 mg/dL   Calcium 8.8 (L) 8.9 - 10.3 mg/dL   Total Protein 5.4 (L) 6.5 -  8.1 g/dL   Albumin 2.5 (L) 3.5 - 5.0 g/dL   AST 45 (H) 15 - 41 U/L   ALT 46 (H) 0 - 44 U/L   Alkaline Phosphatase 151 (H) 38 - 126 U/L   Total Bilirubin 0.6 0.3 - 1.2 mg/dL   GFR, Estimated >62 >83 mL/min   Anion gap 9 5 - 15  Amylase     Status: None   Collection Time: 08/15/21  1:01 AM  Result Value Ref Range   Amylase 49 28 - 100 U/L  Lipase, blood     Status: None   Collection Time: 08/15/21  1:01 AM  Result Value Ref Range   Lipase 45 11 - 51 U/L   Protein/Creat Ratio Pending    Imaging:  US ABDOMEN LIMITED RUQ (LIVER/GB)  Result Date: 08/15/2021 CLINICAL DATA:  Thirty-five weeks pregnant with right upper quadrant pain today. EXAM: ULTRASOUND ABDOMEN LIMITED RIGHT UPPER QUADRANT COMPARISON:  04/20/2018 FINDINGS: Gallbladder: Gallbladder is somewhat contracted but no evidence of stones or sludge. No pericholecystic edema. No wall thickening. Murphy's sign is negative. Common bile duct: Diameter: 3 mm, normal Liver: Focal geographic areas of increased echotexture seen in the liver consistent with geographic areas of focal fatty infiltration. Portal vein is patent on color Doppler imaging with normal direction of blood flow towards the liver. Other: None. IMPRESSION: No evidence of cholelithiasis or acute cholecystitis. Geographic areas of fatty infiltration in the liver. Electronically Signed   By: Burman Nieves M.D.   On: 08/15/2021 01:44     MAU Course/MDM: I have ordered labs and reviewed results. Mild thrombocytopenia.  Slight elevation in transaminases, PCR pending NST reviewed, reacctive Consult Dr Jolayne Panther and Shivaji with presentation, exam findings and test results. They both recommend admission for observation and repeat labs Bile acids sent   Assessment: 1. RUQ abdominal pain   2.    Single IU P at [redacted]w[redacted]d 3.    Gestational hypertension, possible preeclampsia 4.    Itching, all over with more on hands and feet  Plan: Admit for observation  Repeat labs at 6am Dr Reina Fuse to follow  Wynelle Bourgeois CNM, MSN Certified Nurse-Midwife 08/15/2021 2:46 AM

## 2021-08-15 NOTE — MAU Provider Note (Addendum)
Chief Complaint:  Abdominal Pain   Event Date/Time   First Provider Initiated Contact with Patient 08/15/21 0031     HPI: Christy Zuniga is a 31 y.o. [id number] at 66w3dwho presents to maternity admissions reporting pain in upper right abdomen, wrapping around to the back.  Started tonight.  Also having edema of hands and feet and itching . She reports good fetal movement, denies LOF, vaginal bleeding, vaginal itching/burning, urinary symptoms, h/a, dizziness, n/v, diarrhea, constipation or fever/chills.  She denies headache, visual changes  Abdominal Pain This is a new problem. The current episode started today. The problem occurs constantly. The problem has been unchanged. The pain is located in the RUQ. The quality of the pain is aching and cramping. The abdominal pain does not radiate. Pertinent negatives include no fever, headaches or nausea. The pain is relieved by Nothing. She has tried acetaminophen for the symptoms. The treatment provided mild relief.   RN Note: Awoke Tues am at 0430 with pain in RUQ. Pain radiates around side and to back. Dull consistent pain that does not go away. Tylenol helped but did not take it away. Feels like I pulled a muscle in R scapula and unsure if related. Also hands and feet very itch since Friday. Denies VB or LOF. Good FM. 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,71mnths congenatal nevuss N/A     Family  History: Family History  Problem Relation Age of Onset   Cancer Mother    Depression Father    Heart disease Maternal Grandfather     Social History: Social History   Tobacco Use   Smoking status: Never   Smokeless tobacco: Never  Vaping Use   Vaping Use: Never used  Substance Use Topics   Alcohol use: Not Currently    Alcohol/week: 1.0 standard drink    Types: 1 Cans of beer per week    Comment: Occasionally   Drug use: Never    Allergies:  Allergies  Allergen Reactions   Penicillins Hives    Has patient had a PCN reaction causing immediate rash, facial/tongue/throat swelling, SOB or lightheadedness with hypotension: No Has patient had a PCN reaction causing severe rash involving mucus membranes or skin necrosis: No Has patient had a PCN reaction that required hospitalization: No Has patient had a PCN reaction occurring within the last 10 years: No If all of the above answers are "NO", then may proceed with Cephalosporin use.    Dye Fdc Red [Red Dye] Hives    Actually Pink Dye    Meds:  Medications Prior to Admission  Medication Sig Dispense Refill Last Dose   aspirin 81 MG chewable tablet Chew by mouth daily.   08/15/2021   ondansetron (ZOFRAN-ODT) 4 MG disintegrating tablet Take 1  tablet (4 mg total) by mouth every 8 (eight) hours as needed for nausea or vomiting. 15 tablet 0 Past Month   Prenatal Vit-Fe Fumarate-FA (MULTIVITAMIN-PRENATAL) 27-0.8 MG TABS tablet Take 1 tablet by mouth daily at 12 noon.   08/15/2021    I have reviewed patient's Past Medical Hx, Surgical Hx, Family Hx, Social Hx, medications and allergies.   ROS:  Review of Systems  Constitutional:  Negative for fever.  Gastrointestinal:  Positive for abdominal pain. Negative for nausea.  Neurological:  Negative for headaches.  Other systems negative  Physical Exam  Patient Vitals for the past 24 hrs:  BP Temp Pulse Resp SpO2 Height Weight  08/15/21 0231 137/90 -- (!) 50 -- -- -- --  08/15/21  0224 140/76 -- (!) 50 -- -- -- --  08/15/21 0115 138/86 -- 61 -- 99 % -- --  08/15/21 0100 (!) 158/88 -- (!) 54 -- 99 % -- --  08/15/21 0045 129/84 -- 64 -- 99 % -- --  08/15/21 0040 (!) 144/79 -- 60 -- 100 % -- --  08/15/21 0012 (!) 142/91 -- -- -- -- -- --  08/15/21 0008 -- 97.9 F (36.6 C) 61 16 100 % 5\' 3"  (1.6 m) 85.7 kg    Vitals:   08/15/21 0100 08/15/21 0115 08/15/21 0224 08/15/21 0231  BP: (!) 158/88 138/86 140/76 137/90  Pulse: (!) 54 61 (!) 50 (!) 50  Resp:      Temp:      SpO2: 99% 99%    Weight:      Height:        Constitutional: Well-developed, well-nourished female in no acute distress.  Cardiovascular: normal rate and rhythm Respiratory: normal effort, clear to auscultation bilaterally GI: Abd soft, exquisitely tender over RUQ,  abdomen is gravid and appropriate for gestational age.   No rebound or guarding. MS: Extremities nontender, no edema, normal ROM Neurologic: Alert and oriented x 4.   DTRs 3+ with no clonus GU: Neg CVAT.  FHT:  Baseline 120-130 , moderate variability, accelerations present, no decelerations Contractions:  Irregular    Dilation: 1.5 Effacement (%): 70 Station: Ballotable Presentation: Vertex Exam by:: [email or web address], CNM    Labs: Results for orders placed or performed during the hospital encounter of 08/14/21 (from the past 24 hour(s))  CBC     Status: Abnormal   Collection Time: 08/15/21  1:01 AM  Result Value Ref Range   WBC 11.7 (H) 4.0 - 10.5 K/uL   RBC 3.97 3.87 - 5.11 MIL/uL   Hemoglobin 12.6 12.0 - 15.0 g/dL   HCT 10/13/21 (L) 84.1 - 32.4 %   MCV 87.7 80.0 - 100.0 fL   MCH 31.7 26.0 - 34.0 pg   MCHC 36.2 (H) 30.0 - 36.0 g/dL   RDW 40.1 02.7 - 25.3 %   Platelets 120 (L) 150 - 400 K/uL   nRBC 0.0 0.0 - 0.2 %  Comprehensive metabolic panel     Status: Abnormal   Collection Time: 08/15/21  1:01 AM  Result Value Ref Range   Sodium 136 135 - 145 mmol/L   Potassium 3.9 3.5 - 5.1 mmol/L   Chloride 108 98 - 111 mmol/L    CO2 19 (L) 22 - 32 mmol/L   Glucose, Bld 90 70 - 99 mg/dL   BUN 8 6 - 20 mg/dL   Creatinine, Ser 10/13/21 0.44 - 1.00 mg/dL   Calcium 8.8 (L) 8.9 - 10.3 mg/dL   Total Protein 5.4 (L) 6.5 -  8.1 g/dL  ° Albumin 2.5 (L) 3.5 - 5.0 g/dL  ° AST 45 (H) 15 - 41 U/L  ° ALT 46 (H) 0 - 44 U/L  ° Alkaline Phosphatase 151 (H) 38 - 126 U/L  ° Total Bilirubin 0.6 0.3 - 1.2 mg/dL  ° GFR, Estimated >60 >60 mL/min  ° Anion gap 9 5 - 15  °Amylase     Status: None  ° Collection Time: 08/15/21  1:01 AM  °Result Value Ref Range  ° Amylase 49 28 - 100 U/L  °Lipase, blood     Status: None  ° Collection Time: 08/15/21  1:01 AM  °Result Value Ref Range  ° Lipase 45 11 - 51 U/L  ° °Protein/Creat Ratio Pending °  ° °Imaging:  °US ABDOMEN LIMITED RUQ (LIVER/GB) ° °Result Date: 08/15/2021 °CLINICAL DATA:  Thirty-five weeks pregnant with right upper quadrant pain today. EXAM: ULTRASOUND ABDOMEN LIMITED RIGHT UPPER QUADRANT COMPARISON:  04/20/2018 FINDINGS: Gallbladder: Gallbladder is somewhat contracted but no evidence of stones or sludge. No pericholecystic edema. No wall thickening. Murphy's sign is negative. Common bile duct: Diameter: 3 mm, normal Liver: Focal geographic areas of increased echotexture seen in the liver consistent with geographic areas of focal fatty infiltration. Portal vein is patent on color Doppler imaging with normal direction of blood flow towards the liver. Other: None. IMPRESSION: No evidence of cholelithiasis or acute cholecystitis. Geographic areas of fatty infiltration in the liver. Electronically Signed   By: William  Stevens M.D.   On: 08/15/2021 01:44   ° ° °MAU Course/MDM: °I have ordered labs and reviewed results. Mild thrombocytopenia.  Slight elevation in transaminases, PCR pending °NST reviewed, reacctive °Consult Dr Constant and Shivaji with presentation, exam findings and test results. They both recommend admission for observation and repeat labs °Bile acids sent  ° °Assessment: °1. RUQ abdominal pain   °2.    Single IU P at [redacted]w[redacted]d °3.    Gestational hypertension, possible preeclampsia °4.    Itching, all over with more on hands and feet ° °Plan: °Admit for observation  °Repeat labs at 6am °Dr Shivaji to follow ° °Izick Gasbarro CNM, MSN °Certified Nurse-Midwife °08/15/2021 °2:46 AM °

## 2021-08-15 NOTE — Progress Notes (Signed)
Patient ID: Christy Zuniga, female   DOB: 08/24/1990, 31 y.o.   MRN: [id number] Pt feels well - has no complaints. Tolerating being on MgSO4. Mild HA but not enough to request for medication. +FMs. Had visits with MFM and NICU VS: 131/87, 100 GEN - NAD EFM - cat 1 TOCO - no contractions  SVE - resolved    A/P:  30yo [id number] at 43 3/[redacted]wks gestation : Preterm contractions concerning for preterm labor:              -started on MgSO4 x 24hrs ( end at 10am 08/16/21)              - s/p BMZ x 1 ; 2nd dose due at 10am 08/16/21             - GBS swab pending             - continuous monitoring             - NICU and MFM consult completed             - transfer to L/D if cervical change progresses for anticipated delivery otherwise plan for IOL on 08/17/21   HELLP Syndrome              - repeat labs in am: cbc, cmp, urine protein/creatinine ratio             - monitor BP ; treat if indicated              - as per above, IOL for delivery 08/17/21

## 2021-08-15 NOTE — Progress Notes (Addendum)
Patient ID: Christy Zuniga, female   DOB: 1990/07/29, 31 y.o.   MRN: [id number] Pt reports appreciating abdominal tightening regularly coinciding with contractions noted on TOCO. Rates at 3/10in intensity. No HA or blurry vision. 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

## 2021-08-15 NOTE — MAU Note (Signed)
Awoke Tues am at 0430 with pain in RUQ. Pain radiates around side and to back. Dull consistent pain that does not go away. Tylenol helped but did not take it away. Feels like I pulled a muscle in R scapula and unsure if related. Also hands and feet very itch since Friday. Denies VB or LOF. Good FM. Last Tylenol was 1630 and extra strength

## 2021-08-15 NOTE — Progress Notes (Signed)
Patient ID: Christy Zuniga, female   DOB: August 14, 1990, 31 y.o.   MRN: [id number] Spoke with MFM Dr Annamaria Boots.  Pt likely with HELLP syndrome  Recommends complete BMZ after 24hrs while on MgSO4 then plan to induce pt for delivery  Ok to repeat labs in am just for surveillance but keep plan as stated.   Contractions currently resolved Defer sve   BP stable

## 2021-08-15 NOTE — MAU Note (Signed)
Awoke Tues am at 0430 with pain in RUQ. Pain radiates around side and to back. Dull consistent pain that does not go away. Tylenol helped but did not take it away. Feels like I pulled a muscle in R scapula and unsure if related. Also hands and feet very itch since Friday. Denies VB or LOF. Good FM

## 2021-08-15 NOTE — Consult Note (Signed)
Asked by Dr.Banga to provide prenatal consultation for 31 y.o. G2 P1 who is now 35.[redacted] weeks EGA, with pregnancy complicated by pre-eclampsia and possible evolving HELLP syndrome.  She is being treated with betamethasone and Dr. Mindi Slicker hopes to defer delivery if maternal condition allows.  Discussed with patient and her husband usual expectations for preterm infant at 35+ weeks gestation, including possibility that NICU admission would not be necessary (their previous child at 35 wks weighed > 6 lb and did not require NICU. Discussed criteria for NICU, including need for respiratory support, hypothermia, inadequate feeding, low glucose. Also discussed criteria for discharge regarding adequate oral intake. Explained family-centered care approach including parent participation in rounds, decision-making, and discharge planning.  Discussed advantages of feeding with mother's milk and possible use of donor milk as "bridge" if needed until her supply is sufficient. She plans to breast feed but reports lack of success breast feeding previous 35 weeker. They had no objections to use of donor milk.  Patient and FOB were attentive, had appropriate questions, and were appreciative of my input.  Thank you for consulting Neonatology.  Total time 35 minutes, face-to-face time 15 minutes  JWimmer, MD

## 2021-08-15 NOTE — Consult Note (Addendum)
MFM Note  Christy Zuniga is a 31 year old gravida 3 para 1 currently at 35 weeks and 3 days.  She was seen in consultation today at the request of Dr. Terri Piedra due to probable HELLP syndrome.    The patient was admitted overnight due to severe right upper quadrant pain.  She reports that the right upper quadrant pain started about 24 hours prior to admission. She had a right upper quadrant ultrasound that indicated no signs of cholelithiasis and no signs of acute cholecystitis.    On admission, she was noted to have mildly elevated blood pressures in the 130s to 140s over 80s to 90s range.  Her [place] labs showed elevated liver function tests with an ALT level of 46 and AST level of 50.  She was also noted to have low platelets of 115,000.  Her serum creatinine level was within normal limits.  Her P/C ratio did not indicate significant proteinuria.  Her repeat PIH labs this morning continues to show elevated liver function tests and low platelets, although the labs appear to have stabilized.    The patient reports that she continues to have right upper quadrant pain, although it is more tolerable at this time.  Her fetal status has been reassuring.  Her cervix has progressed from 1 cm dilated to 3 cm dilated.    Due to probable HELLP syndrome, she is receiving a complete course of antenatal corticosteroids and has been started on magnesium sulfate for maternal seizure prophylaxis.  The patient reports one prior vaginal delivery at 35 weeks and 6 days following PPROM.    She denies any other significant past medical history.  The patient and her husband were advised that based on her elevated blood pressures and the elevated liver function tests along with low platelets and right upper quadrant pain, she most likely has HELLP syndrome/atypical preeclampsia.    They understand that delivery is the only treatment for HELLP syndrome/preeclampsia.    At her current gestational age, I would recommend that she  should proceed with delivery once she completes her antenatal steroid course.  She should continue magnesium for maternal seizure prophylaxis until after delivery.    I will order a growth ultrasound for her to assess the fetal weight and amniotic fluid level prior to delivery.  As her [place] labs are stable, she should just have repeat PIH labs drawn again tomorrow morning.  As she is receiving antenatal corticosteroids, I anticipate that her liver function tests and platelet counts may remain stable or even normalize.  The patient and her husband are happy and comfortable with proceeding with delivery in 2 days.  They understand that there still is a small possibility that her baby may require a NICU admission after birth.  The patient and her husband stated that all of their questions have been answered.  Addendum: 5pm 08/15/2021  An ultrasound performed today shows an EFW of 6 pounds 5 ounces (70th percentile).   There was normal amniotic fluid with a total AFI of 10.1 cm.   A BPP was 8 out of 8.   The fetus is in the vertex presentation.

## 2021-08-15 NOTE — MAU Provider Note (Signed)
Chief Complaint:  Abdominal Pain   Event Date/Time   First Provider Initiated Contact with Patient 08/15/21 0031     HPI: Christy Zuniga is a 31 y.o. [id number] at 66w3dwho presents to maternity admissions reporting pain in upper right abdomen, wrapping around to the back.  Started tonight.  Also having edema of hands and feet and itching . She reports good fetal movement, denies LOF, vaginal bleeding, vaginal itching/burning, urinary symptoms, h/a, dizziness, n/v, diarrhea, constipation or fever/chills.  She denies headache, visual changes  Abdominal Pain This is a new problem. The current episode started today. The problem occurs constantly. The problem has been unchanged. The pain is located in the RUQ. The quality of the pain is aching and cramping. The abdominal pain does not radiate. Pertinent negatives include no fever, headaches or nausea. The pain is relieved by Nothing. She has tried acetaminophen for the symptoms. The treatment provided mild relief.   RN Note: Awoke Tues am at 0430 with pain in RUQ. Pain radiates around side and to back. Dull consistent pain that does not go away. Tylenol helped but did not take it away. Feels like I pulled a muscle in R scapula and unsure if related. Also hands and feet very itch since Friday. Denies VB or LOF. Good FM. 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,71mnths congenatal nevuss N/A     Family  History: Family History  Problem Relation Age of Onset   Cancer Mother    Depression Father    Heart disease Maternal Grandfather     Social History: Social History   Tobacco Use   Smoking status: Never   Smokeless tobacco: Never  Vaping Use   Vaping Use: Never used  Substance Use Topics   Alcohol use: Not Currently    Alcohol/week: 1.0 standard drink    Types: 1 Cans of beer per week    Comment: Occasionally   Drug use: Never    Allergies:  Allergies  Allergen Reactions   Penicillins Hives    Has patient had a PCN reaction causing immediate rash, facial/tongue/throat swelling, SOB or lightheadedness with hypotension: No Has patient had a PCN reaction causing severe rash involving mucus membranes or skin necrosis: No Has patient had a PCN reaction that required hospitalization: No Has patient had a PCN reaction occurring within the last 10 years: No If all of the above answers are "NO", then may proceed with Cephalosporin use.    Dye Fdc Red [Red Dye] Hives    Actually Pink Dye    Meds:  Medications Prior to Admission  Medication Sig Dispense Refill Last Dose   aspirin 81 MG chewable tablet Chew by mouth daily.   08/15/2021   ondansetron (ZOFRAN-ODT) 4 MG disintegrating tablet Take 1  tablet (4 mg total) by mouth every 8 (eight) hours as needed for nausea or vomiting. 15 tablet 0 Past Month   Prenatal Vit-Fe Fumarate-FA (MULTIVITAMIN-PRENATAL) 27-0.8 MG TABS tablet Take 1 tablet by mouth daily at 12 noon.   08/15/2021    I have reviewed patient's Past Medical Hx, Surgical Hx, Family Hx, Social Hx, medications and allergies.   ROS:  Review of Systems  Constitutional:  Negative for fever.  Gastrointestinal:  Positive for abdominal pain. Negative for nausea.  Neurological:  Negative for headaches.  Other systems negative  Physical Exam  Patient Vitals for the past 24 hrs:  BP Temp Pulse Resp SpO2 Height Weight  08/15/21 0012 (!) 142/91 -- -- -- -- -- --  08/15/21  0008 -- 97.9 F (36.6 C) 61 16 100 % 5\' 3"  (1.6 m) 85.7 kg   Vitals:   08/15/21 0040 08/15/21 0045 08/15/21 0100 08/15/21 0115  BP: (!) 144/79 129/84 (!) 158/88 138/86  Pulse: 60 64 (!) 54 61  Resp:      Temp:      SpO2: 100% 99% 99% 99%  Weight:      Height:        Constitutional: Well-developed, well-nourished female in no acute distress.  Cardiovascular: normal rate and rhythm Respiratory: normal effort, clear to auscultation bilaterally GI: Abd soft, exquisitely tender over RUQ,  abdomen is gravid and appropriate for gestational age.   No rebound or guarding. MS: Extremities nontender, no edema, normal ROM Neurologic: Alert and oriented x 4.   DTRs 3+ with no clonus GU: Neg CVAT.  FHT:  Baseline 120-130 , moderate variability, accelerations present, no decelerations Contractions:  Irregular    Dilation: 1.5 Effacement (%): 70 Station: Ballotable Presentation: Vertex Exam by:: Hansel Feinstein, CNM    Labs: Results for orders placed or performed during the hospital encounter of 08/14/21 (from the past 24 hour(s))  CBC     Status: Abnormal   Collection Time: 08/15/21  1:01 AM  Result Value Ref Range   WBC 11.7 (H) 4.0 - 10.5 K/uL   RBC 3.97 3.87 - 5.11 MIL/uL   Hemoglobin 12.6 12.0 - 15.0 g/dL   HCT 34.8 (L) 36.0 - 46.0 %   MCV 87.7 80.0 - 100.0 fL   MCH 31.7 26.0 - 34.0 pg   MCHC 36.2 (H) 30.0 - 36.0 g/dL   RDW 13.1 11.5 - 15.5 %   Platelets 120 (L) 150 - 400 K/uL   nRBC 0.0 0.0 - 0.2 %  Comprehensive metabolic panel     Status: Abnormal   Collection Time: 08/15/21  1:01 AM  Result Value Ref Range   Sodium 136 135 - 145 mmol/L   Potassium 3.9 3.5 - 5.1 mmol/L   Chloride 108 98 - 111 mmol/L   CO2 19 (L) 22 - 32 mmol/L   Glucose, Bld 90 70 - 99 mg/dL   BUN 8 6 - 20 mg/dL   Creatinine, Ser 0.82 0.44 - 1.00 mg/dL   Calcium 8.8 (L) 8.9 - 10.3 mg/dL   Total Protein 5.4 (L) 6.5 - 8.1 g/dL   Albumin 2.5 (L) 3.5 - 5.0 g/dL   AST 45 (H) 15 - 41 U/L   ALT 46 (H) 0 -  44 U/L   Alkaline Phosphatase 151 (H) 38 - 126 U/L   Total Bilirubin 0.6 0.3 - 1.2 mg/dL   GFR, Estimated >60 >60 mL/min   Anion gap 9 5 - 15  Amylase     Status: None  Collection Time: 08/15/21  1:01 AM  Result Value Ref Range   Amylase 49 28 - 100 U/L  Lipase, blood     Status: None   Collection Time: 08/15/21  1:01 AM  Result Value Ref Range   Lipase 45 11 - 51 U/L   Protein/Creat Ratio Pending    Imaging:  US ABDOMEN LIMITED RUQ (LIVER/GB)  Result Date: 08/15/2021 CLINICAL DATA:  Thirty-five weeks pregnant with right upper quadrant pain today. EXAM: ULTRASOUND ABDOMEN LIMITED RIGHT UPPER QUADRANT COMPARISON:  04/20/2018 FINDINGS: Gallbladder: Gallbladder is somewhat contracted but no evidence of stones or sludge. No pericholecystic edema. No wall thickening. Murphy's sign is negative. Common bile duct: Diameter: 3 mm, normal Liver: Focal geographic areas of increased echotexture seen in the liver consistent with geographic areas of focal fatty infiltration. Portal vein is patent on color Doppler imaging with normal direction of blood flow towards the liver. Other: None. IMPRESSION: No evidence of cholelithiasis or acute cholecystitis. Geographic areas of fatty infiltration in the liver. Electronically Signed   By: Lucienne Capers M.D.   On: 08/15/2021 01:44     MAU Course/MDM: I have ordered labs and reviewed results. Mild thrombocytopenia.  Slight elevation in transaminases, PCR pending NST reviewed, reacctive Consult Dr Elly Modena and Shivaji with presentation, exam findings and test results. They both recommend admission for observation and repeat labs Bile acids sent   Assessment: 1. RUQ abdominal pain   2.   Single IU P at [redacted]w[redacted]d 3.    Gestational hypertension, possible preeclampsia 4.    Itching, all over with more on hands and feet  Plan: Admit for observation  Repeat labs at [address] [place] Blase to follow  Hansel Feinstein CNM, MSN Certified Nurse-Midwife 08/15/2021 12:31  AM

## 2021-08-15 NOTE — Progress Notes (Signed)
CHART REVIEW 2/2 MULTIPLE DELIVERIES SINCE ADMISSION  Category 1 tracing, some irritability on TOCO initially however this has resolved. Repeat labs pending this AM. uPC from admission 0.12. Normotensive since admission. No calls since admission. Will follow labs once results BP 120/71 (BP Location: Right Arm)    Pulse 66    Temp 98.2 F (36.8 C) (Oral)    Resp 16    Ht 5\' 3"  (1.6 m)    Wt 85.7 kg    SpO2 100%    BMI 33.48 kg/m

## 2021-08-16 LAB — CBC WITH DIFFERENTIAL/PLATELET
Abs Immature Granulocytes: 0.14 10*3/uL — ABNORMAL HIGH (ref 0.00–0.07)
Basophils Absolute: 0 10*3/uL (ref 0.0–0.1)
Basophils Relative: 0 %
Eosinophils Absolute: 0 10*3/uL (ref 0.0–0.5)
Eosinophils Relative: 0 %
HCT: 37.9 % (ref 36.0–46.0)
Hemoglobin: 13.2 g/dL (ref 12.0–15.0)
Immature Granulocytes: 1 %
Lymphocytes Relative: 20 %
Lymphs Abs: 2 10*3/uL (ref 0.7–4.0)
MCH: 31.3 pg (ref 26.0–34.0)
MCHC: 34.8 g/dL (ref 30.0–36.0)
MCV: 89.8 fL (ref 80.0–100.0)
Monocytes Absolute: 0.7 10*3/uL (ref 0.1–1.0)
Monocytes Relative: 7 %
Neutro Abs: 7 10*3/uL (ref 1.7–7.7)
Neutrophils Relative %: 72 %
Platelets: 150 10*3/uL (ref 150–400)
RBC: 4.22 MIL/uL (ref 3.87–5.11)
RDW: 13.4 % (ref 11.5–15.5)
WBC: 9.8 10*3/uL (ref 4.0–10.5)
nRBC: 0 % (ref 0.0–0.2)

## 2021-08-16 LAB — COMPREHENSIVE METABOLIC PANEL
ALT: 64 U/L — ABNORMAL HIGH (ref 0–44)
AST: 56 U/L — ABNORMAL HIGH (ref 15–41)
Albumin: 2.7 g/dL — ABNORMAL LOW (ref 3.5–5.0)
Alkaline Phosphatase: 151 U/L — ABNORMAL HIGH (ref 38–126)
Anion gap: 10 (ref 5–15)
BUN: 7 mg/dL (ref 6–20)
CO2: 21 mmol/L — ABNORMAL LOW (ref 22–32)
Calcium: 7.2 mg/dL — ABNORMAL LOW (ref 8.9–10.3)
Chloride: 103 mmol/L (ref 98–111)
Creatinine, Ser: 0.79 mg/dL (ref 0.44–1.00)
GFR, Estimated: >60 mL/min (ref >60)
Glucose, Bld: 134 mg/dL — ABNORMAL HIGH (ref 70–99)
Potassium: 3.7 mmol/L (ref 3.5–5.1)
Sodium: 134 mmol/L — ABNORMAL LOW (ref 135–145)
Total Bilirubin: 0.5 mg/dL (ref 0.3–1.2)
Total Protein: 5.9 g/dL — ABNORMAL LOW (ref 6.5–8.1)

## 2021-08-16 LAB — BILE ACIDS, TOTAL: Bile Acids Total: 10.3 umol/L — ABNORMAL HIGH (ref 0.0–10.0)

## 2021-08-16 NOTE — Progress Notes (Signed)
Christy Zuniga 31 y.o. 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 Feeling okay on the magnesium. No current headache. Normal fetal movement. No ctx, LOF, VB.  O: Vitals:   08/16/21 0651 08/16/21 0700 08/16/21 0755 08/16/21 1147  BP:   134/87 (!) 135/96  Pulse:   (!) 103 95  Resp: 18 18 18 18   Temp:   98 F (36.7 C) 98 F (36.7 C)  TempSrc:   Oral Oral  SpO2:   100% 100%  Weight:      Height:       Gen: well appearing, resting in bed CVS: RRR Lungs: CTAB Abd: soft, gravid. + RUQ tenderness Neuro: brisk 2+ upper and lower extremity reflexes no clonus Ext: no calf edema or tenderness  Fetal tracing: 120bpm, mod variability, + accels, no decels Toco: acontractile  CBC Latest Ref Rng & Units 08/16/2021 08/15/2021 08/15/2021  WBC 4.0 - 10.5 K/uL 9.8 8.1 11.4(H)  Hemoglobin 12.0 - 15.0 g/dL 13.2 12.5 12.5  Hematocrit 36.0 - 46.0 % 37.9 35.1(L) 34.7(L)  Platelets 150 - 400 K/uL 150 117(L) 115(L)    CMP Latest Ref Rng & Units 08/16/2021 08/15/2021 08/15/2021  Glucose 70 - 99 mg/dL 134(H) 123(H) 86  BUN 6 - 20 mg/dL 7 6 8   Creatinine 0.44 - 1.00 mg/dL 0.79 0.74 0.77  Sodium 135 - 145 mmol/L 134(L) 136 136  Potassium 3.5 - 5.1 mmol/L 3.7 3.7 3.7  Chloride 98 - 111 mmol/L 103 105 107  CO2 22 - 32 mmol/L 21(L) 21(L) 21(L)  Calcium 8.9 - 10.3 mg/dL 7.2(L) 8.0(L) 8.8(L)  Total Protein 6.5 - 8.1 g/dL 5.9(L) 5.3(L) 5.1(L)  Total Bilirubin 0.3 - 1.2 mg/dL 0.5 0.4 0.6  Alkaline Phos 38 - 126 U/L 151(H) 161(H) 145(H)  AST 15 - 41 U/L 56(H) 50(H) 50(H)  ALT 0 - 44 U/L 64(H) 49(H) 46(H)      A/p: Christy Zuniga 31 y.o. 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   There was normal amniotic fluid with a total AFI of 10.1 cm.   A BPP was 8 out of 8.   The fetus is in the vertex presentation. - Previous contractions yesterday evening are resolved today on magnesium  Rowland Lathe 08/16/21 3:33 PM

## 2021-08-17 ENCOUNTER — Inpatient Hospital Stay (HOSPITAL_COMMUNITY): Payer: 59 | Admitting: Anesthesiology

## 2021-08-17 ENCOUNTER — Encounter (HOSPITAL_COMMUNITY): Payer: Self-pay | Admitting: Obstetrics and Gynecology

## 2021-08-17 LAB — DIC (DISSEMINATED INTRAVASCULAR COAGULATION)PANEL
D-Dimer, Quant: 2.38 ug/mL-FEU — ABNORMAL HIGH (ref 0.00–0.50)
Fibrinogen: 501 mg/dL — ABNORMAL HIGH (ref 210–475)
INR: 1 (ref 0.8–1.2)
Platelets: 177 10*3/uL (ref 150–400)
Prothrombin Time: 12.8 seconds (ref 11.4–15.2)
Smear Review: NONE SEEN
aPTT: 24 seconds (ref 24–36)

## 2021-08-17 LAB — CBC
HCT: 36.6 % (ref 36.0–46.0)
HCT: 36.3 % (ref 36.0–46.0)
Hemoglobin: 12.5 g/dL (ref 12.0–15.0)
Hemoglobin: 12.7 g/dL (ref 12.0–15.0)
MCH: 31.1 pg (ref 26.0–34.0)
MCH: 31.8 pg (ref 26.0–34.0)
MCHC: 34.2 g/dL (ref 30.0–36.0)
MCHC: 35 g/dL (ref 30.0–36.0)
MCV: 91 fL (ref 80.0–100.0)
MCV: 91 fL (ref 80.0–100.0)
Platelets: 172 10*3/uL (ref 150–400)
Platelets: 164 10*3/uL (ref 150–400)
RBC: 4.02 MIL/uL (ref 3.87–5.11)
RBC: 3.99 MIL/uL (ref 3.87–5.11)
RDW: 13.4 % (ref 11.5–15.5)
RDW: 13.2 % (ref 11.5–15.5)
WBC: 11.7 10*3/uL — ABNORMAL HIGH (ref 4.0–10.5)
WBC: 10.4 10*3/uL (ref 4.0–10.5)
nRBC: 0.3 % — ABNORMAL HIGH (ref 0.0–0.2)
nRBC: 0.5 % — ABNORMAL HIGH (ref 0.0–0.2)

## 2021-08-17 LAB — COMPREHENSIVE METABOLIC PANEL
ALT: 171 U/L — ABNORMAL HIGH (ref 0–44)
ALT: 191 U/L — ABNORMAL HIGH (ref 0–44)
AST: 157 U/L — ABNORMAL HIGH (ref 15–41)
AST: 160 U/L — ABNORMAL HIGH (ref 15–41)
Albumin: 2.6 g/dL — ABNORMAL LOW (ref 3.5–5.0)
Albumin: 2.5 g/dL — ABNORMAL LOW (ref 3.5–5.0)
Alkaline Phosphatase: 168 U/L — ABNORMAL HIGH (ref 38–126)
Alkaline Phosphatase: 160 U/L — ABNORMAL HIGH (ref 38–126)
Anion gap: 10 (ref 5–15)
Anion gap: 9 (ref 5–15)
BUN: 11 mg/dL (ref 6–20)
BUN: 10 mg/dL (ref 6–20)
CO2: 20 mmol/L — ABNORMAL LOW (ref 22–32)
CO2: 20 mmol/L — ABNORMAL LOW (ref 22–32)
Calcium: 7.2 mg/dL — ABNORMAL LOW (ref 8.9–10.3)
Calcium: 6.7 mg/dL — ABNORMAL LOW (ref 8.9–10.3)
Chloride: 105 mmol/L (ref 98–111)
Chloride: 104 mmol/L (ref 98–111)
Creatinine, Ser: 0.93 mg/dL (ref 0.44–1.00)
Creatinine, Ser: 0.76 mg/dL (ref 0.44–1.00)
GFR, Estimated: >60 mL/min (ref >60)
GFR, Estimated: >60 mL/min (ref >60)
Glucose, Bld: 111 mg/dL — ABNORMAL HIGH (ref 70–99)
Glucose, Bld: 92 mg/dL (ref 70–99)
Potassium: 4.4 mmol/L (ref 3.5–5.1)
Potassium: 4.2 mmol/L (ref 3.5–5.1)
Sodium: 135 mmol/L (ref 135–145)
Sodium: 133 mmol/L — ABNORMAL LOW (ref 135–145)
Total Bilirubin: 0.4 mg/dL (ref 0.3–1.2)
Total Bilirubin: 0.6 mg/dL (ref 0.3–1.2)
Total Protein: 5.8 g/dL — ABNORMAL LOW (ref 6.5–8.1)
Total Protein: 5.5 g/dL — ABNORMAL LOW (ref 6.5–8.1)

## 2021-08-17 LAB — CULTURE, BETA STREP (GROUP B ONLY)

## 2021-08-17 LAB — RPR: RPR Ser Ql: NONREACTIVE

## 2021-08-17 LAB — MAGNESIUM
Magnesium: 4.9 mg/dL — ABNORMAL HIGH (ref 1.7–2.4)
Magnesium: 4.9 mg/dL — ABNORMAL HIGH (ref 1.7–2.4)

## 2021-08-17 MED ORDER — ACETAMINOPHEN 325 MG PO TABS: 650.0000 mg | ORAL_TABLET | ORAL | Status: DC | PRN

## 2021-08-17 MED ORDER — ONDANSETRON HCL 4 MG/2ML IJ SOLN: 4.0000 mg | INTRAMUSCULAR | Status: DC | PRN

## 2021-08-17 MED ORDER — POLYETHYLENE GLYCOL 3350 17 G PO PACK
17.0000 g | PACK | Freq: Every day | ORAL | Status: DC | PRN
  Filled 2021-08-17: qty 1

## 2021-08-17 MED ORDER — SOD CITRATE-CITRIC ACID 500-334 MG/5ML PO SOLN: 30.0000 mL | ORAL | Status: DC | PRN

## 2021-08-17 MED ORDER — PHENYLEPHRINE 40 MCG/ML (10ML) SYRINGE FOR IV PUSH (FOR BLOOD PRESSURE SUPPORT): 80.0000 ug | PREFILLED_SYRINGE | INTRAVENOUS | Status: DC | PRN

## 2021-08-17 MED ORDER — DIPHENHYDRAMINE HCL 50 MG/ML IJ SOLN: 12.5000 mg | INTRAMUSCULAR | Status: DC | PRN

## 2021-08-17 MED ORDER — ONDANSETRON HCL 4 MG/2ML IJ SOLN: 4.0000 mg | Freq: Four times a day (QID) | INTRAMUSCULAR | Status: DC | PRN

## 2021-08-17 MED ORDER — LACTATED RINGERS IV SOLN
500.0000 mL | INTRAVENOUS | Status: DC | PRN
  Administered 2021-08-17: 500 mL via INTRAVENOUS

## 2021-08-17 MED ORDER — FENTANYL-BUPIVACAINE-NACL 0.5-0.125-0.9 MG/250ML-% EP SOLN
12.0000 mL/h | EPIDURAL | Status: DC | PRN
  Administered 2021-08-17: 12 mL/h via EPIDURAL
  Filled 2021-08-17: qty 250

## 2021-08-17 MED ORDER — OXYTOCIN BOLUS FROM INFUSION
333.0000 mL | Freq: Once | INTRAVENOUS | Status: AC
  Administered 2021-08-17: 333 mL via INTRAVENOUS

## 2021-08-17 MED ORDER — OXYTOCIN-SODIUM CHLORIDE 30-0.9 UT/500ML-% IV SOLN: 2.5000 [IU]/h | INTRAVENOUS | Status: DC

## 2021-08-17 MED ORDER — LIDOCAINE HCL (PF) 1 % IJ SOLN
INTRAMUSCULAR | Status: DC | PRN
  Administered 2021-08-17 (×2): 5 mL via EPIDURAL

## 2021-08-17 MED ORDER — PHENYLEPHRINE 40 MCG/ML (10ML) SYRINGE FOR IV PUSH (FOR BLOOD PRESSURE SUPPORT)
80.0000 ug | PREFILLED_SYRINGE | INTRAVENOUS | Status: DC | PRN
  Filled 2021-08-17: qty 10

## 2021-08-17 MED ORDER — SIMETHICONE 80 MG PO CHEW
80.0000 mg | CHEWABLE_TABLET | ORAL | Status: DC | PRN
  Filled 2021-08-17: qty 1

## 2021-08-17 MED ORDER — LACTATED RINGERS IV SOLN: INTRAVENOUS | Status: DC

## 2021-08-17 MED ORDER — LIDOCAINE HCL (PF) 1 % IJ SOLN: 30.0000 mL | INTRAMUSCULAR | Status: DC | PRN

## 2021-08-17 MED ORDER — TERBUTALINE SULFATE 1 MG/ML IJ SOLN: 0.2500 mg | Freq: Once | INTRAMUSCULAR | Status: DC | PRN

## 2021-08-17 MED ORDER — WITCH HAZEL-GLYCERIN EX PADS: 1.0000 "application " | MEDICATED_PAD | CUTANEOUS | Status: DC | PRN

## 2021-08-17 MED ORDER — DIBUCAINE (PERIANAL) 1 % EX OINT: 1.0000 "application " | TOPICAL_OINTMENT | CUTANEOUS | Status: DC | PRN

## 2021-08-17 MED ORDER — ONDANSETRON HCL 4 MG PO TABS: 4.0000 mg | ORAL_TABLET | ORAL | Status: DC | PRN

## 2021-08-17 MED ORDER — COCONUT OIL OIL: 1.0000 "application " | TOPICAL_OIL | Status: DC | PRN

## 2021-08-17 MED ORDER — BENZOCAINE-MENTHOL 20-0.5 % EX AERO
1.0000 "application " | INHALATION_SPRAY | CUTANEOUS | Status: DC | PRN
  Filled 2021-08-17: qty 56

## 2021-08-17 MED ORDER — FENTANYL CITRATE (PF) 100 MCG/2ML IJ SOLN: 50.0000 ug | INTRAMUSCULAR | Status: DC | PRN

## 2021-08-17 MED ORDER — OXYCODONE-ACETAMINOPHEN 5-325 MG PO TABS: 2.0000 | ORAL_TABLET | ORAL | Status: DC | PRN

## 2021-08-17 MED ORDER — OXYCODONE HCL 5 MG PO TABS
5.0000 mg | ORAL_TABLET | ORAL | Status: DC | PRN
  Administered 2021-08-19: 5 mg via ORAL
  Filled 2021-08-17: qty 1

## 2021-08-17 MED ORDER — IBUPROFEN 600 MG PO TABS
600.0000 mg | ORAL_TABLET | Freq: Four times a day (QID) | ORAL | Status: DC
  Administered 2021-08-17 – 2021-08-19 (×7): 600 mg via ORAL
  Filled 2021-08-17 (×7): qty 1

## 2021-08-17 MED ORDER — EPHEDRINE 5 MG/ML INJ: 10.0000 mg | INTRAVENOUS | Status: DC | PRN

## 2021-08-17 MED ORDER — VANCOMYCIN HCL IN DEXTROSE 1-5 GM/200ML-% IV SOLN
1000.0000 mg | Freq: Two times a day (BID) | INTRAVENOUS | Status: DC
  Administered 2021-08-17: 1000 mg via INTRAVENOUS
  Filled 2021-08-17: qty 200

## 2021-08-17 MED ORDER — OXYTOCIN-SODIUM CHLORIDE 30-0.9 UT/500ML-% IV SOLN
1.0000 m[IU]/min | INTRAVENOUS | Status: DC
  Administered 2021-08-17: 2 m[IU]/min via INTRAVENOUS
  Filled 2021-08-17: qty 500

## 2021-08-17 MED ORDER — OXYCODONE HCL 5 MG PO TABS: 10.0000 mg | ORAL_TABLET | ORAL | Status: DC | PRN

## 2021-08-17 MED ORDER — OXYCODONE-ACETAMINOPHEN 5-325 MG PO TABS: 1.0000 | ORAL_TABLET | ORAL | Status: DC | PRN

## 2021-08-17 MED ORDER — LACTATED RINGERS IV SOLN: 500.0000 mL | Freq: Once | INTRAVENOUS | Status: DC

## 2021-08-17 MED ORDER — TETANUS-DIPHTH-ACELL PERTUSSIS 5-2.5-18.5 LF-MCG/0.5 IM SUSY: 0.5000 mL | PREFILLED_SYRINGE | Freq: Once | INTRAMUSCULAR | Status: DC

## 2021-08-17 NOTE — Progress Notes (Signed)
Comfortable with epidural  BP (!) 137/94    Pulse 68    Temp 98.4 F (36.9 C) (Oral)    Resp 16    Ht 5\' 3"  (1.6 m)    Wt 85.7 kg    SpO2 100%    BMI 33.48 kg/m   Toco: q2 minutes EFM: 140s, moderate variability, no decelerations, category 1 SVE: 4/80/-2, AROM clear fluid  Lab Results  Component Value Date   WBC 11.7 (H) 08/17/2021   HGB 12.5 08/17/2021   HCT 36.6 08/17/2021   MCV 91.0 08/17/2021   PLT 177 08/17/2021   CMP Latest Ref Rng & Units 08/17/2021 08/16/2021 08/15/2021  Glucose 70 - 99 mg/dL 10/13/2021) 010(U) 725(D)  BUN 6 - 20 mg/dL 11 7 6   Creatinine 0.44 - 1.00 mg/dL 664(Q 0.34  Sodium 135 - 145 mmol/L 135 134(L) 136  Potassium 3.5 - 5.1 mmol/L 4.4 3.7 3.7  Chloride 98 - 111 mmol/L 105 103 105  CO2 22 - 32 mmol/L 20(L) 21(L) 21(L)  Calcium 8.9 - 10.3 mg/dL 7.2(L) 7.2(L) 8.0(L)  Total Protein 6.5 - 8.1 g/dL 7.42) 5.9(L) 5.3(L)  Total Bilirubin 0.3 - 1.2 mg/dL 0.4 0.5 0.4  Alkaline Phos 38 - 126 U/L 168(H) 151(H) 161(H)  AST 15 - 41 U/L 157(H) 56(H) 50(H)  ALT 0 - 44 U/L 171(H) 64(H) 49(H)     A/P: 5.95 @ [redacted]w[redacted]d with atypical HELLP Continue IOL with pitocin.  Anticipate SVD HELLP:  continue magnesium sulfate at 1gm/hr.  Magnesium level therapeutic. With last CMP, creatinine is slowly trending up (0.93) and AST / ALT jumped to 157/171.  Will continue to trend GBS just returned negative.  Will stop antibiotics

## 2021-08-17 NOTE — Plan of Care (Signed)
°  Problem: Education: Goal: Ability to make informed decisions regarding treatment and plan of care will improve Outcome: Progressing   Problem: Pain Management: Goal: Relief or control of pain from uterine contractions will improve Outcome: Progressing

## 2021-08-17 NOTE — Progress Notes (Signed)
31 yo [id number] @ [redacted]w[redacted]d HD#3 admitted with atypical HELLP.  Symptoms and hospital course reviewed.    She reports RUQ pain is persistent but less severe than day of admission.  Mild headache yesterday, has resolved, but reports history of headaches outside of pregnancy.  Continues to feel flushed on magnesium  BP 130/90    Pulse 87    Temp 97.9 F (36.6 C) (Oral)    Resp 16    Ht 5\' 3"  (1.6 m)    Wt 85.7 kg    SpO2 98%    BMI 33.48 kg/m   NAD Abd: soft, gravid, +RUQ tenderness.  EFW 6# Neuro:  3+ brisk LE DTRs, no clonus Ext: no edema SVE: 3/80/-2, cephalic EFM: 140s, moderate variability Toco: q10-15 min   Growth on 2/8:  EFW 6lb 5oz (70%), cephalic, AFI 10  A/P  Atypical HELLP:  Appreciate MFM recommendations.  LFTs were slowly trending up since admission (now 56/64), platelets have stabilized after steroids temporarily (151 yesterday) persistent RUQ pain.  Repeat labs from today are pending.  BPs intermittent mild elevation, no severe.  MFM recommended given likley atypical HELLP to proceed with delivery today now that betamethasone complete --transfer to L&D --start pitocin --continue magnesium sulfate for seizure prophylaxis through 24 hours postpartum.   --serial labs --GBS pending (collected 2/8).  Hives with PCN.  Will start vanc for gbs unknown and monitor for return of culture

## 2021-08-17 NOTE — Anesthesia Procedure Notes (Signed)
Epidural Patient location during procedure: OB Start time: 08/17/2021 3:10 PM End time: 08/17/2021 3:21 PM  Staffing Anesthesiologist: Mal Amabile, MD Performed: anesthesiologist   Preanesthetic Checklist Completed: patient identified, IV checked, site marked, risks and benefits discussed, surgical consent, monitors and equipment checked, pre-op evaluation and timeout performed  Epidural Patient position: sitting Prep: DuraPrep and site prepped and draped Patient monitoring: continuous pulse ox and blood pressure Approach: midline Location: L4-L5 Injection technique: LOR air  Needle:  Needle type: Tuohy  Needle gauge: 17 G Needle length: 9 cm and 9 Needle insertion depth: 6 cm Catheter type: closed end flexible Catheter size: 19 Gauge Catheter at skin depth: 10 cm Test dose: negative and Other  Assessment Events: blood not aspirated, injection not painful, no injection resistance, no paresthesia and negative IV test  Additional Notes Patient identified. Risks and benefits discussed including failed block, incomplete  Pain control, post dural puncture headache, nerve damage, paralysis, blood pressure Changes, nausea, vomiting, reactions to medications-both toxic and allergic and post Partum back pain. All questions were answered. Patient expressed understanding and wished to proceed. Sterile technique was used throughout procedure. Epidural site was Dressed with sterile barrier dressing. No paresthesias, signs of intravascular injection Or signs of intrathecal spread were encountered.  Patient was more comfortable after the epidural was dosed. Please see RN's note for documentation of vital signs and FHR which are stable. Reason for block:procedure for pain

## 2021-08-17 NOTE — Anesthesia Preprocedure Evaluation (Signed)
Anesthesia Evaluation  Patient identified by MRN, date of birth, ID band Patient awake    Reviewed: Allergy & Precautions, Patient's Chart, lab work & pertinent test results  Airway Mallampati: II  TM Distance: >3 FB Neck ROM: Full    Dental no notable dental hx. (+) Teeth Intact   Pulmonary neg pulmonary ROS,    Pulmonary exam normal breath sounds clear to auscultation       Cardiovascular hypertension, Normal cardiovascular exam Rhythm:Regular Rate:Normal     Neuro/Psych negative neurological ROS  negative psych ROS   GI/Hepatic Neg liver ROS, GERD  ,  Endo/Other  Obesity  Renal/GU negative Renal ROS  negative genitourinary   Musculoskeletal negative musculoskeletal ROS (+)   Abdominal (+) + obese,   Peds  Hematology Atypical HELLP syndrome   Anesthesia Other Findings   Reproductive/Obstetrics                             Anesthesia Physical Anesthesia Plan  ASA: 3  Anesthesia Plan: Epidural   Post-op Pain Management:    Induction:   PONV Risk Score and Plan:   Airway Management Planned: Natural Airway  Additional Equipment:   Intra-op Plan:   Post-operative Plan:   Informed Consent: I have reviewed the patients History and Physical, chart, labs and discussed the procedure including the risks, benefits and alternatives for the proposed anesthesia with the patient or authorized representative who has indicated his/her understanding and acceptance.       Plan Discussed with: Anesthesiologist  Anesthesia Plan Comments:         Anesthesia Quick Evaluation

## 2021-08-18 LAB — CBC
HCT: 32.1 % — ABNORMAL LOW (ref 36.0–46.0)
Hemoglobin: 10.7 g/dL — ABNORMAL LOW (ref 12.0–15.0)
MCH: 30.9 pg (ref 26.0–34.0)
MCHC: 33.3 g/dL (ref 30.0–36.0)
MCV: 92.8 fL (ref 80.0–100.0)
Platelets: 141 10*3/uL — ABNORMAL LOW (ref 150–400)
RBC: 3.46 MIL/uL — ABNORMAL LOW (ref 3.87–5.11)
RDW: 13.4 % (ref 11.5–15.5)
WBC: 11.3 10*3/uL — ABNORMAL HIGH (ref 4.0–10.5)
nRBC: 0.6 % — ABNORMAL HIGH (ref 0.0–0.2)

## 2021-08-18 LAB — COMPREHENSIVE METABOLIC PANEL
ALT: 200 U/L — ABNORMAL HIGH (ref 0–44)
AST: 154 U/L — ABNORMAL HIGH (ref 15–41)
Albumin: 2.2 g/dL — ABNORMAL LOW (ref 3.5–5.0)
Alkaline Phosphatase: 134 U/L — ABNORMAL HIGH (ref 38–126)
Anion gap: 8 (ref 5–15)
BUN: 12 mg/dL (ref 6–20)
CO2: 23 mmol/L (ref 22–32)
Calcium: 6.7 mg/dL — ABNORMAL LOW (ref 8.9–10.3)
Chloride: 103 mmol/L (ref 98–111)
Creatinine, Ser: 0.86 mg/dL (ref 0.44–1.00)
GFR, Estimated: >60 mL/min (ref >60)
Glucose, Bld: 90 mg/dL (ref 70–99)
Potassium: 4.1 mmol/L (ref 3.5–5.1)
Sodium: 134 mmol/L — ABNORMAL LOW (ref 135–145)
Total Bilirubin: 0.4 mg/dL (ref 0.3–1.2)
Total Protein: 4.7 g/dL — ABNORMAL LOW (ref 6.5–8.1)

## 2021-08-18 LAB — MAGNESIUM: Magnesium: 4.5 mg/dL — ABNORMAL HIGH (ref 1.7–2.4)

## 2021-08-18 NOTE — Lactation Note (Signed)
This note was copied from a baby's chart. Lactation Consultation Note Mom has chosen just to formula feed.  Patient Name: Christy Zuniga IRCVE'L Date: 08/18/2021   Age:30 hours  Maternal Data    Feeding Nipple Type: Slow - flow  LATCH Score                    Lactation Tools Discussed/Used    Interventions    Discharge    Consult Status Consult Status: Complete Date: 08/18/21    Charyl Dancer 08/18/2021, 4:39 AM

## 2021-08-18 NOTE — Anesthesia Postprocedure Evaluation (Signed)
Anesthesia Post Note  Patient: Christy Zuniga  Procedure(s) Performed: AN AD HOC LABOR EPIDURAL     Patient location during evaluation: Mother Baby Anesthesia Type: Epidural Level of consciousness: awake Pain management: satisfactory to patient Vital Signs Assessment: post-procedure vital signs reviewed and stable Respiratory status: spontaneous breathing Cardiovascular status: stable Anesthetic complications: no   No notable events documented.  Last Vitals:  Vitals:   08/18/21 0425 08/18/21 0819  BP: (!) 149/92 129/90  Pulse: (!) 56 64  Resp: 18 19  Temp: 36.7 C 36.5 C  SpO2: 99% 99%    Last Pain:  Vitals:   08/18/21 0819  TempSrc: Oral  PainSc:    Pain Goal: Patients Stated Pain Goal: 4 (08/17/21 0745)                 Cephus Shelling

## 2021-08-18 NOTE — Progress Notes (Signed)
PPD #1 No problems Afeb, VSS, BP overall ok so far Fundus firm, NT at U-1 DTR 2/4 Labs stable, Hgb 12.7 to 10.7  Will continue magnesium until 1800, monitor BP and treat if continuously elevated

## 2021-08-19 MED ORDER — IBUPROFEN 600 MG PO TABS: 600.0000 mg | ORAL_TABLET | Freq: Four times a day (QID) | ORAL | 0 refills | Status: AC

## 2021-08-19 NOTE — Discharge Summary (Signed)
Postpartum Discharge Summary      Patient Name: Christy Zuniga DOB: 03-Jul-1991 MRN: [id number]  Date of admission: 08/14/2021 Delivery date:08/17/2021  Delivering provider: Marlow Baars  Date of discharge: 08/19/2021  Admitting diagnosis: Gestational hypertension [O13.9] HELLP (hemolytic anemia/elev liver enzymes/low platelets in pregnancy) [O14.20] Intrauterine pregnancy: [redacted]w[redacted]d     Secondary diagnosis:  Principal Problem:   Gestational hypertension Active Problems:   HELLP (hemolytic anemia/elev liver enzymes/low platelets in pregnancy)      Discharge diagnosis: Preterm Pregnancy Delivered and Preeclampsia (severe)                                               Hospital course: Induction of Labor With Vaginal Delivery   31 y.o. yo [id number] at [redacted]w[redacted]d was admitted to the hospital 08/14/2021 for induction of labor.  Indication for induction: Preeclampsia, HELLP.  Patient had an uncomplicated labor course as follows: Membrane Rupture Time/Date: 3:53 PM ,08/17/2021   Delivery Method:Vaginal, Spontaneous  Episiotomy: None  Lacerations:  2nd degree;Vaginal;Labial  Details of delivery can be found in separate delivery note.  Patient received 24 hours of magnesium postpartum, labs remained stable, BP stable without treatment. Patient is discharged home 08/19/21.  Newborn Data: Birth date:08/17/2021  Birth time:5:45 PM  Gender:Female  Living status:Living  Apgars:8 ,9  Weight:2710 g   Magnesium Sulfate received: Yes: Seizure prophylaxis  Physical exam  Vitals:   08/18/21 1942 08/18/21 2337 08/19/21 0348 08/19/21 0903  BP: (!) 135/95 127/77 138/86 132/83  Pulse: 81 61 (!) 47 (!) 55  Resp: 18 17  (!) 58  Temp: 98.3 F (36.8 C) 98.3 F (36.8 C) 98.2 F (36.8 C) 98.2 F (36.8 C)  TempSrc: Oral Oral Oral Oral  SpO2: 100% 100% 99% 99%  Weight:      Height:       General: alert Lochia: appropriate Uterine Fundus: firm  Labs: Lab Results  Component Value Date   WBC 11.3 (H)  08/18/2021   HGB 10.7 (L) 08/18/2021   HCT 32.1 (L) 08/18/2021   MCV 92.8 08/18/2021   PLT 141 (L) 08/18/2021   CMP Latest Ref Rng & Units 08/18/2021  Glucose 70 - 99 mg/dL 90  BUN 6 - 20 mg/dL 12  Creatinine 5.57 - 3.22 mg/dL 0.25  Sodium 427 - 062 mmol/L 134(L)  Potassium 3.5 - 5.1 mmol/L 4.1  Chloride 98 - 111 mmol/L 103  CO2 22 - 32 mmol/L 23  Calcium 8.9 - 10.3 mg/dL 6.7(L)  Total Protein 6.5 - 8.1 g/dL 4.7(L)  Total Bilirubin 0.3 - 1.2 mg/dL 0.4  Alkaline Phos 38 - 126 U/L 134(H)  AST 15 - 41 U/L 154(H)  ALT 0 - 44 U/L 200(H)   Inocente Salles Score: Edinburgh Postnatal Depression Scale Screening Tool 08/19/2021  I have been able to laugh and see the funny side of things. 0  I have looked forward with enjoyment to things. 0  I have blamed myself unnecessarily when things went wrong. 0  I have been anxious or worried for no good reason. 1  I have felt scared or panicky for no good reason. 0  Things have been getting on top of me. 1  I have been so unhappy that I have had difficulty sleeping. 0  I have felt sad or miserable. 0  I have been so unhappy that I have been crying. 0  The thought of harming myself has occurred to me. 0  Edinburgh Postnatal Depression Scale Total 2      After visit meds:  Allergies as of 08/19/2021       Reactions   Penicillins Hives   Has patient had a PCN reaction causing immediate rash, facial/tongue/throat swelling, SOB or lightheadedness with hypotension: No Has patient had a PCN reaction causing severe rash involving mucus membranes or skin necrosis: No Has patient had a PCN reaction that required hospitalization: No Has patient had a PCN reaction occurring within the last 10 years: No If all of the above answers are "NO", then may proceed with Cephalosporin use.   Dye Fdc Red [red Dye] Hives   Actually Pink Dye        Medication List     STOP taking these medications    aspirin 81 MG chewable tablet       TAKE these  medications    ibuprofen 600 MG tablet Commonly known as: ADVIL Take 1 tablet (600 mg total) by mouth every 6 (six) hours.   multivitamin-prenatal 27-0.8 MG Tabs tablet Take 1 tablet by mouth daily at 12 noon.   ondansetron 4 MG disintegrating tablet Commonly known as: ZOFRAN-ODT Take 1 tablet (4 mg total) by mouth every 8 (eight) hours as needed for nausea or vomiting.         Discharge home in stable condition Infant Feeding: Breast Infant Disposition:home with mother Discharge instruction: per After Visit Summary and Postpartum booklet. Activity: Advance as tolerated. Pelvic rest for 6 weeks.  Diet: routine diet Postpartum Appointment: 4-5 days for BP check Follow up Visit:  Follow-up Information     Marlow Baars, MD. Schedule an appointment as soon as possible for a visit.   Specialty: Obstetrics Why: for BP check Contact information: [address] [place] [address] [phone]                     08/19/2021 Zenaida Niece, MD

## 2021-08-19 NOTE — Progress Notes (Signed)
PPD #2 Feeling ok-better off magnesium, some cramps Afeb, VSS, BP currently normal Fundus firm  Will d/c home later today, check BP in office this week

## 2021-08-19 NOTE — Discharge Instructions (Signed)
As per discharge pamphlet °

## 2021-08-21 LAB — SURGICAL PATHOLOGY

## 2021-08-28 ENCOUNTER — Telehealth (HOSPITAL_COMMUNITY): Payer: Self-pay

## 2021-08-28 NOTE — Telephone Encounter (Signed)
"  I'm doing good." Patient declines questions or concerns about her healing.  "He's doing great. Eating well, gaining weight, and having good diapers. He sleeps in a bassinet in our room." RN reviewed ABC's of safe sleep with patient. Patient declines any questions or concerns about baby.  EPDS score is 1.  Christy Zuniga [facility] 08/28/2021,1303

## 2022-06-02 IMAGING — US US MFM FETAL BPP W/O NON-STRESS
1 series · 12 of 28 positions shown · non-contrast
Comparison: none

[Series 1: us mfm fetal bpp w/o non-stress · 63 acquisitions, 12 frames shown]
[im 3/63]
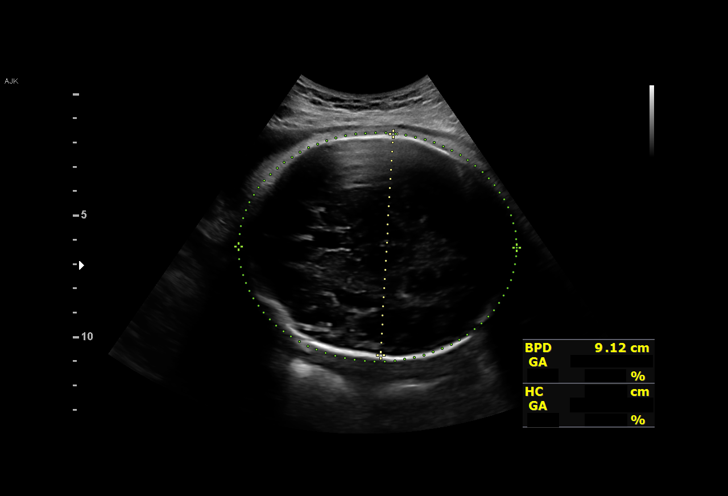
[im 7/63]
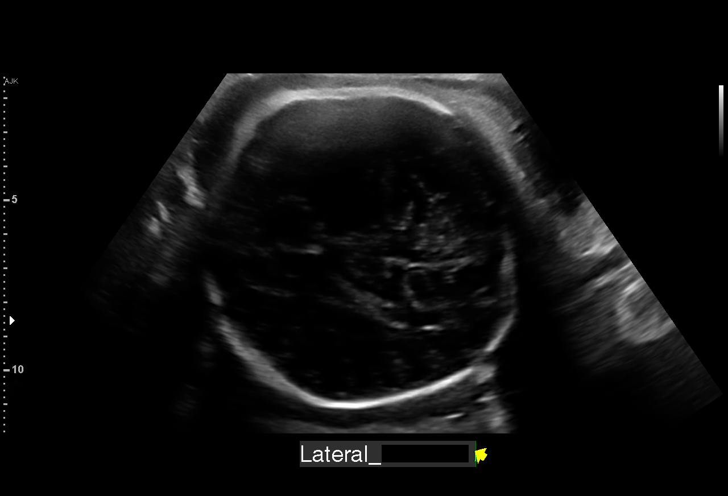
[im 12/63]
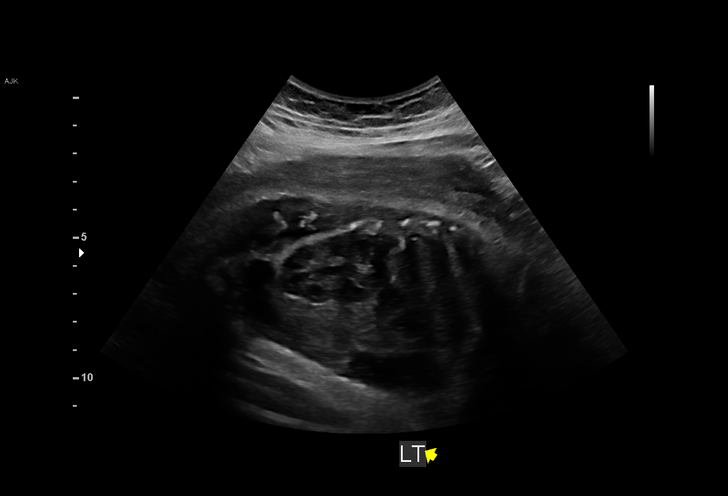
[im 19/63]
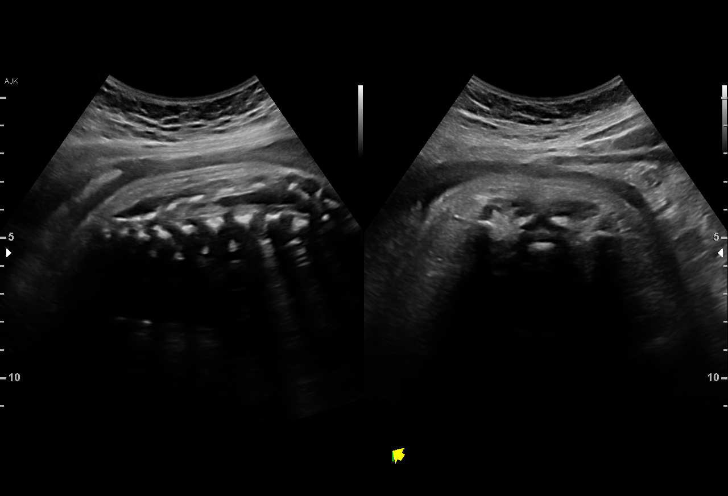
[im 23/63]
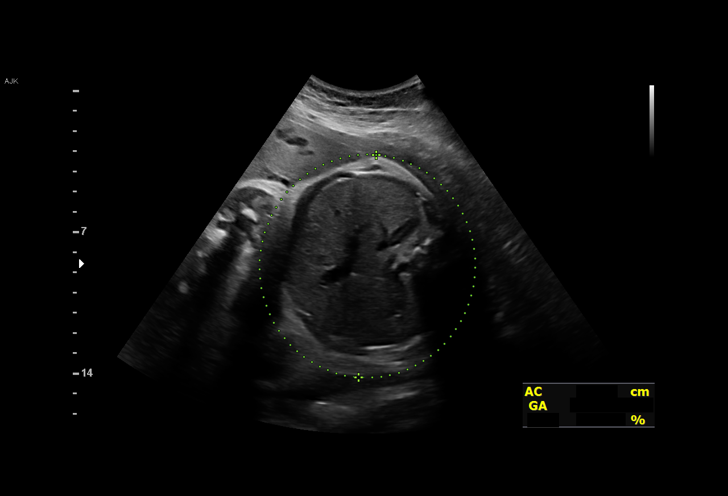
[im 28/63]
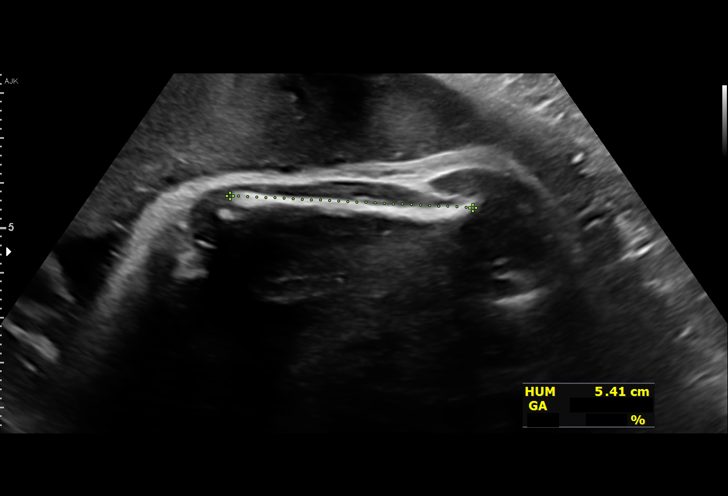
[im 35/63]
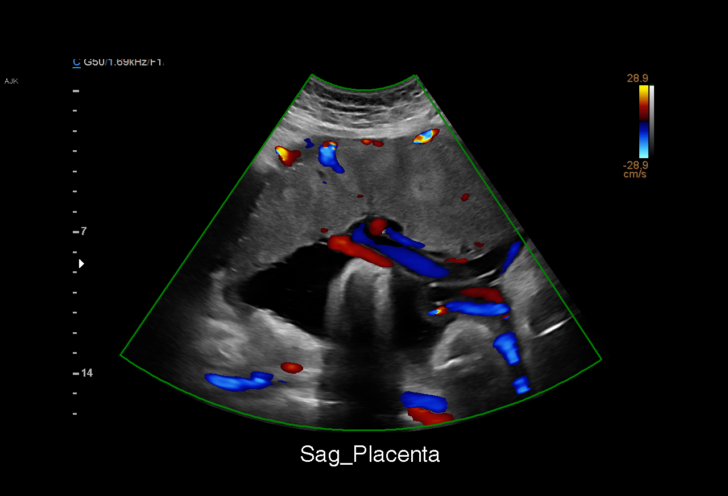
[im 40/63]
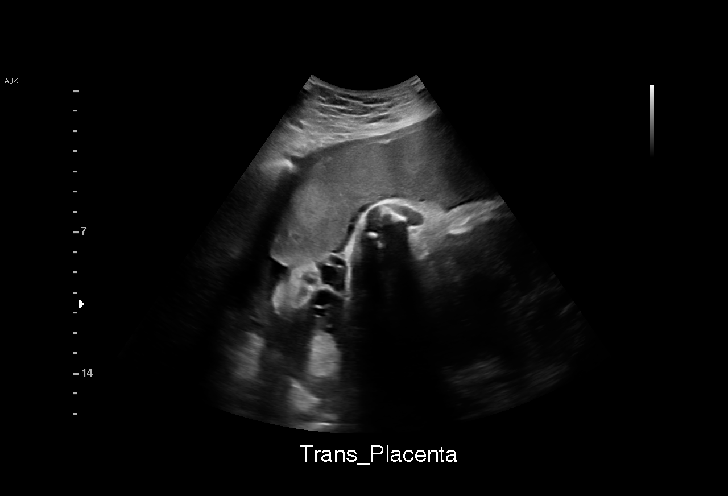
[im 44/63]
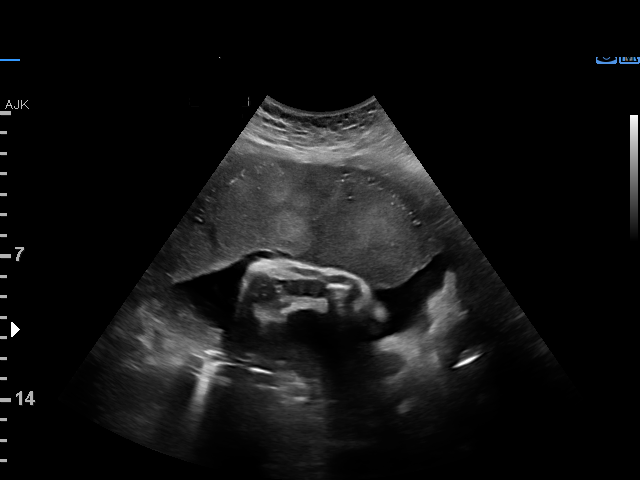
[im 51/63]
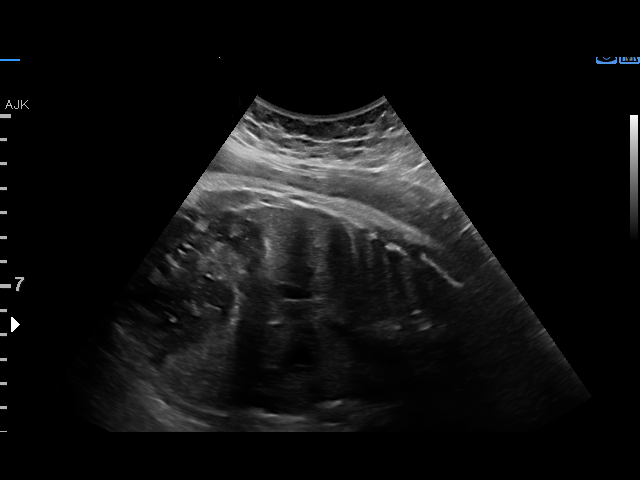
[im 56/63]
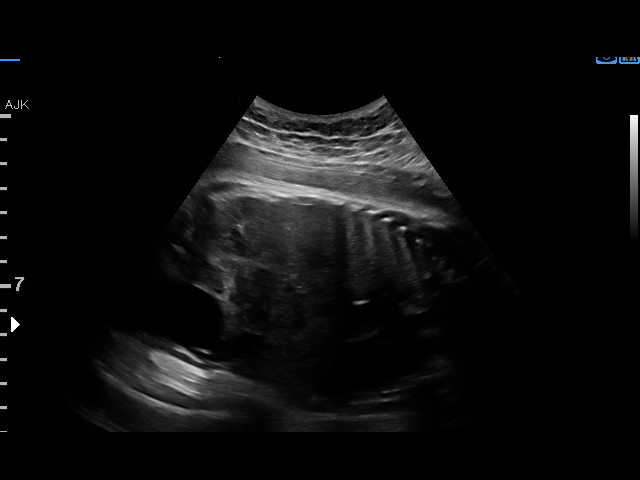
[im 60/63]
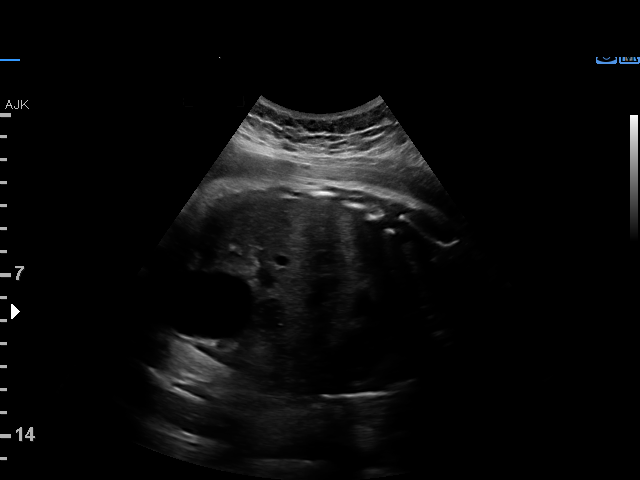

[12 of 28 positions shown; findings below may reference images not displayed]

Indications

 Hypertension - Severe preeclampsia +/-
 HELLP
 Poor obstetric history: Previous preterm
 delivery, antepartum
 35 weeks gestation of pregnancy
 Encounter for antenatal screening for
 malformations
Fetal Evaluation

 Num Of Fetuses:         1
 Fetal Heart Rate(bpm):  131
 Cardiac Activity:       Observed
 Presentation:           Cephalic
 Placenta:               Anterior
 P. Cord Insertion:      Visualized

 Amniotic Fluid
 AFI FV:      Within normal limits

 AFI Sum(cm)     %Tile       Largest Pocket(cm)
 10.1            22

 RUQ(cm)       RLQ(cm)       LUQ(cm)        LLQ(cm)

Biophysical Evaluation

 Amniotic F.V:   Within normal limits       F. Tone:        Observed
 F. Movement:    Observed                   Score:          [DATE]
 F. Breathing:   Observed
Biometry
 BPD:      90.9  mm     G. Age:  36w 6d         88  %    CI:        79.42   %    70 - 86
                                                         FL/HC:      19.8   %    20.1 -
 HC:      322.4  mm     G. Age:  36w 3d         41  %    HC/AC:      0.96        0.93 -
 AC:      335.2  mm     G. Age:  37w 3d         96  %    FL/BPD:     70.1   %    71 - 87
 FL:       63.7  mm     G. Age:  32w 6d        2.6  %    FL/AC:      19.0   %    20 - 24
 HUM:      54.8  mm     G. Age:  31w 6d        < 5  %
 CER:      48.4  mm     G. Age:  36w 2d         55  %
 LV:        5.4  mm

 Est. FW:    1011  gm      6 lb 5 oz     70  %
OB History

 Gravidity:    3         Term:   1         SAB:   1
Gestational Age

 Clinical EDD:  35w 3d                                        EDD:   09/16/21
 U/S Today:     35w 6d                                        EDD:   09/13/21
 Best:          35w 3d     Det. By:  Clinical EDD             EDD:   09/16/21
Anatomy

 Cranium:               Appears normal         Aortic Arch:            Not well visualized
 Cavum:                 Appears normal         Ductal Arch:            Not well visualized
 Ventricles:            Appears normal         Diaphragm:              Appears normal
 Choroid Plexus:        Appears normal         Stomach:                Appears normal, left
                                                                       sided
 Cerebellum:            Appears normal         Abdomen:                Appears normal
 Posterior Fossa:       Appears normal         Abdominal Wall:         Not well visualized
 Nuchal Fold:           Not applicable (>20    Cord Vessels:           Not well visualized
                        wks GA)
 Face:                  Appears normal         Kidneys:                Appear normal
                        (orbits and profile)
 Lips:                  Appears normal         Bladder:                Appears normal
 Thoracic:              Appears normal         Spine:                  Appears normal
 Heart:                 Not well visualized    Upper Extremities:      Not well visualized
 RVOT:                  Not well visualized    Lower Extremities:      Not well visualized
 LVOT:                  Not well visualized
Comments

 MFM Note

 Tanvi Leary is a 30-year-old gravida 3 para 1 currently at 35
 weeks and 3 days.  She was seen in consultation today at the
 request of Dr. Billiot due to probable HELLP syndrome.

 The patient was admitted overnight due to severe right upper
 quadrant pain.  She reports that the right upper quadrant pain
 started about 24 hours prior to admission. She had a right
 upper quadrant ultrasound that indicated no signs of
 cholelithiasis and no signs of acute cholecystitis.

 On admission, she was noted to have mildly elevated blood
 pressures in the 130s to 140s over 80s to 90s range.  Her
 PIH labs showed elevated liver function tests with an ALT
 level of 46 and AST level of 50.  She was also noted to have
 low platelets of [DATE].  Her serum creatinine level was
 within normal limits.  Her P/C ratio did not indicate significant
 proteinuria.  Her repeat PIH labs this morning continues to
 show elevated liver function tests and low platelets, although
 the labs appear to have stabilized.

 The patient reports that she continues to have right upper
 quadrant pain, although it is more tolerable at this time.  Her
 fetal status has been reassuring.  Her cervix has progressed
 from 1 cm dilated to 3 cm dilated.

 Due to probable HELLP syndrome, she is receiving a
 complete course of antenatal corticosteroids and has been
 started on magnesium sulfate for maternal seizure
 prophylaxis.

 The patient reports one prior vaginal delivery at 35 weeks and
 6 days following PPROM.

 She denies any other significant past medical history.

 The patient and her husband were advised that based on her
 elevated blood pressures and the elevated liver function tests
 along with low platelets and right upper quadrant pain, she
 most likely has HELLP syndrome/atypical preeclampsia.

 They understand that delivery is the only treatment for HELLP
 syndrome/preeclampsia.

 At her current gestational age, I would recommend that she
 should proceed with delivery once she completes her
 antenatal steroid course.  She should continue magnesium
 for maternal seizure prophylaxis until after delivery.

 I will order a growth ultrasound for her to assess the fetal
 weight and amniotic fluid level prior to delivery.

 As her PIH labs are stable, she should just have repeat PIH
 labs drawn again tomorrow morning.  As she is receiving
 antenatal corticosteroids, I anticipate that her liver function
 tests and platelet counts may remain stable or even
 normalize.

 The patient and her husband are happy and comfortable with
 proceeding with delivery in 2 days.  They understand that
 there still is a small possibility that her baby may require a
 NICU admission after birth.

 The patient and her husband stated that all of their questions
 have been answered.

 Addendum: 5pm 08/15/2021

 An ultrasound performed today shows an EFW of 6 pounds 5
 ounces (70th percentile).
 There was normal amniotic fluid with a total AFI of 10.1 cm.
 A BPP was [DATE].
 The fetus is in the vertex presentation.

## 2022-06-02 IMAGING — US US ABDOMEN LIMITED
1 series · 15 of 25 positions shown · non-contrast
Comparison: 04/20/2018

CLINICAL DATA: Thirty-five weeks pregnant with right upper quadrant
pain today.

EXAM:
ULTRASOUND ABDOMEN LIMITED RIGHT UPPER QUADRANT

[Series 1: us abdomen limited · 15 of 58 slices shown]
[im 1/58]
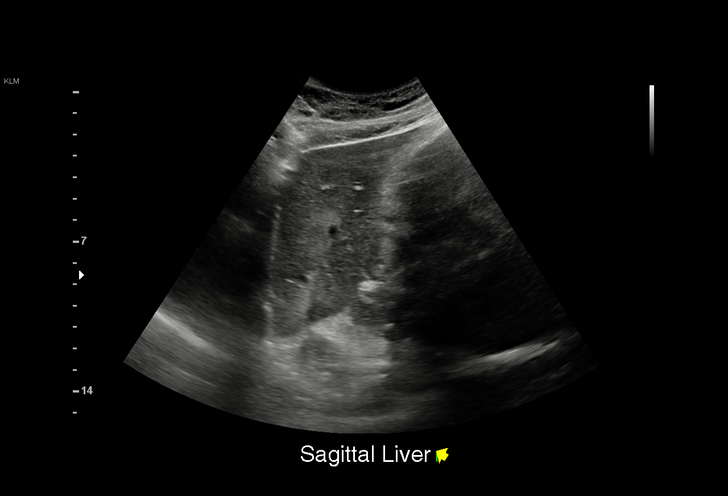
[im 5/58]
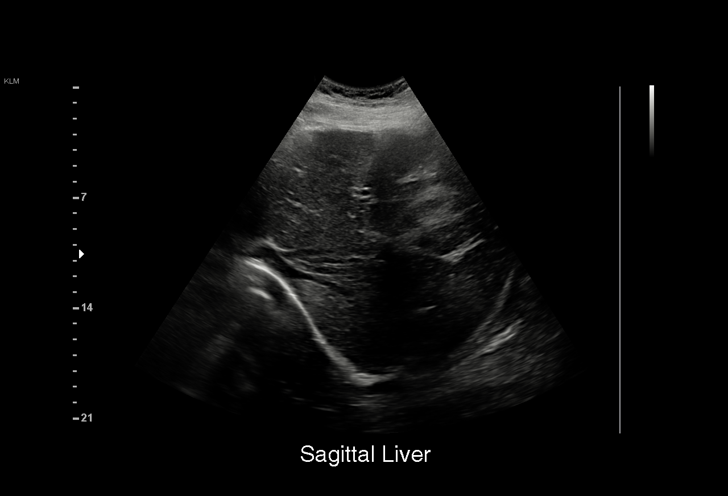
[im 10/58]
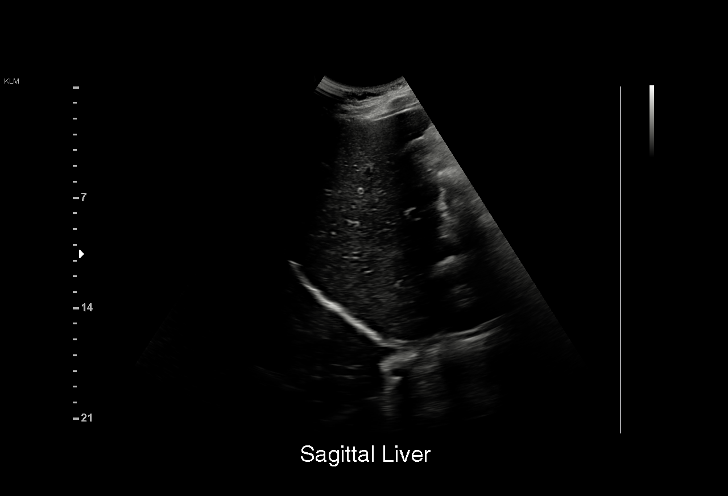
[im 12/58]
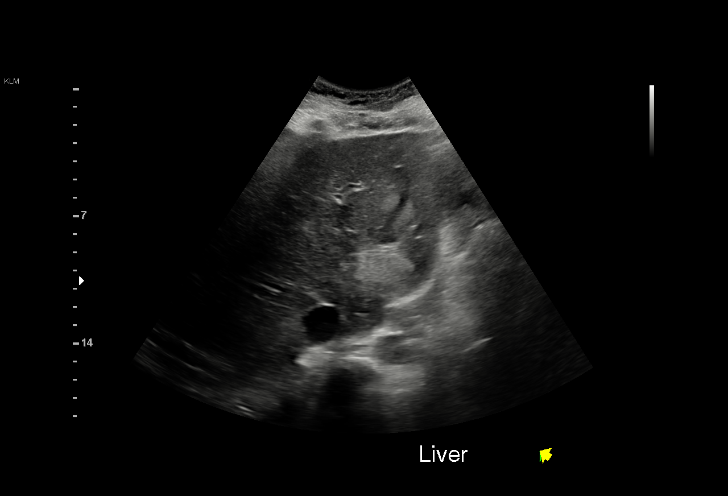
[im 17/58]
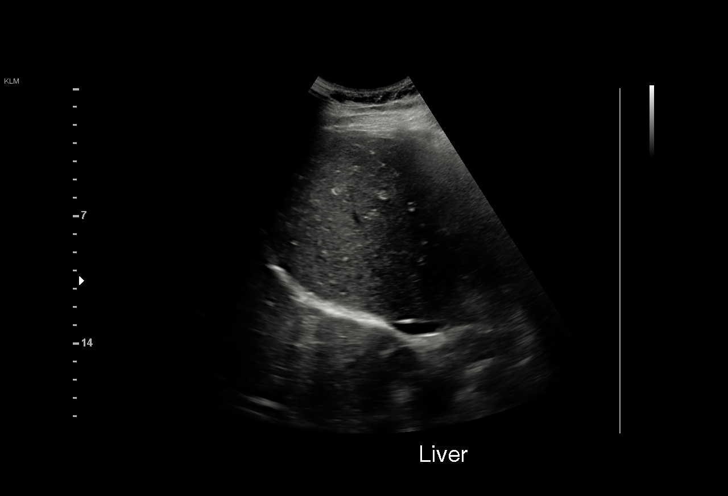
[im 22/58]
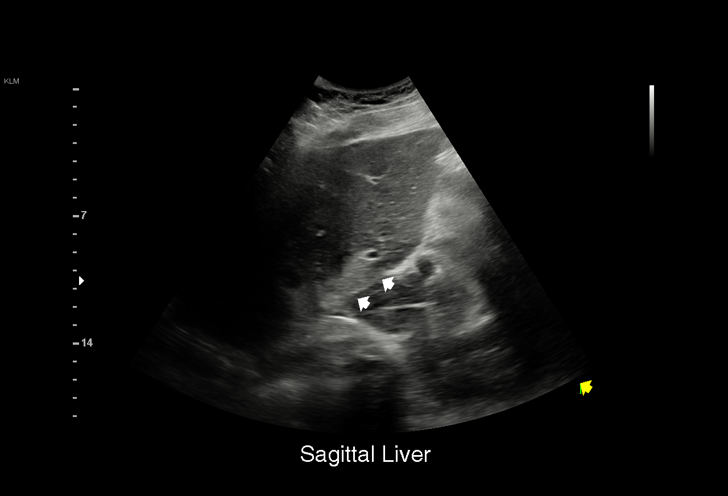
[im 24/58]
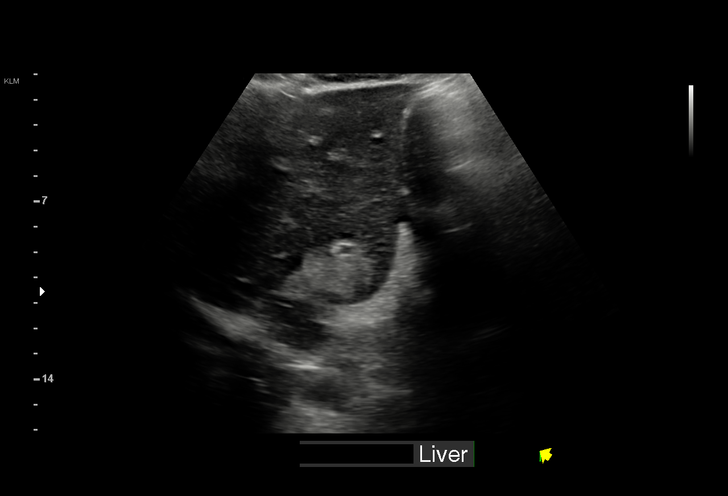
[im 29/58]
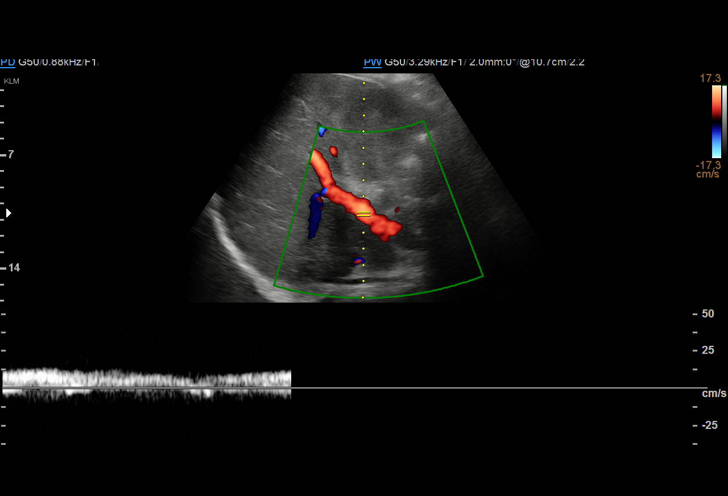
[im 34/58]
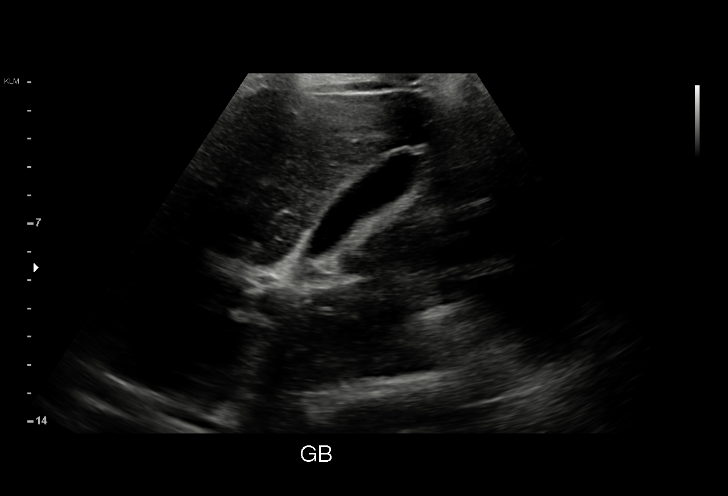
[im 36/58]
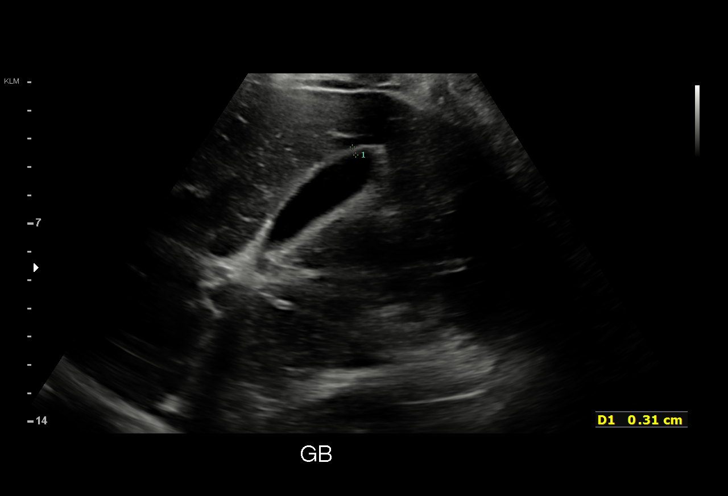
[im 41/58]
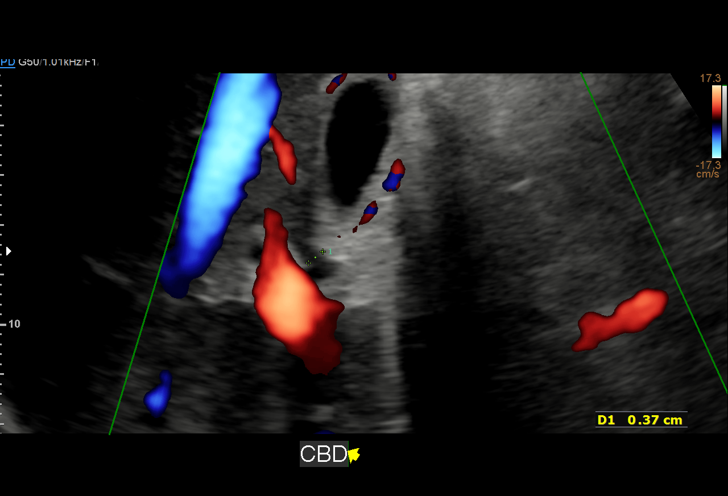
[im 46/58]
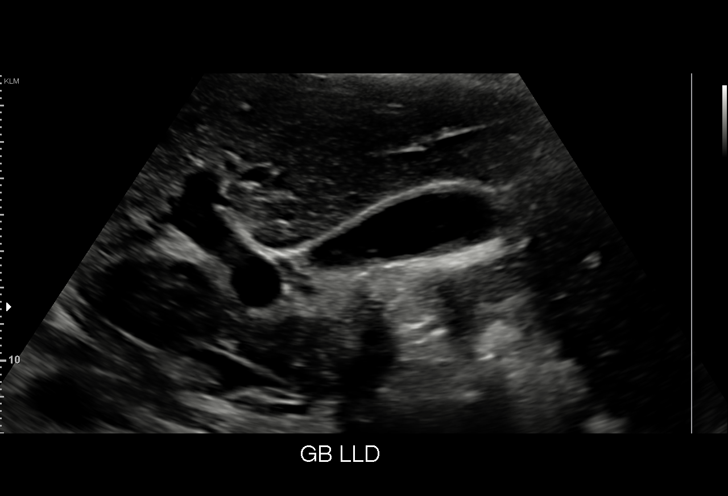
[im 48/58]
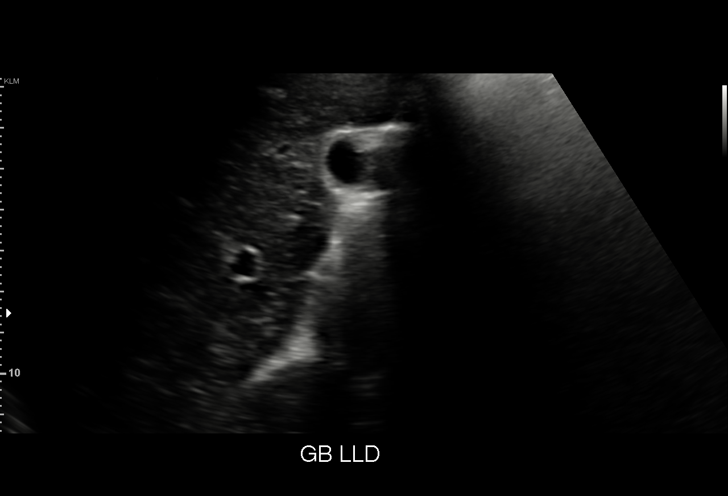
[im 53/58]
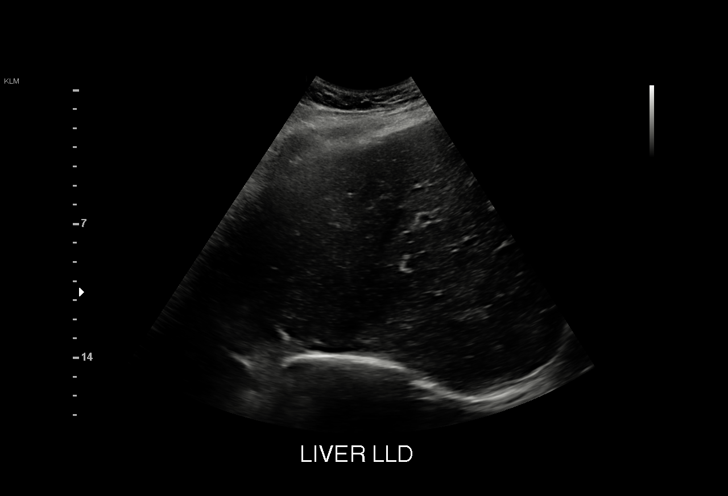
[im 58/58]
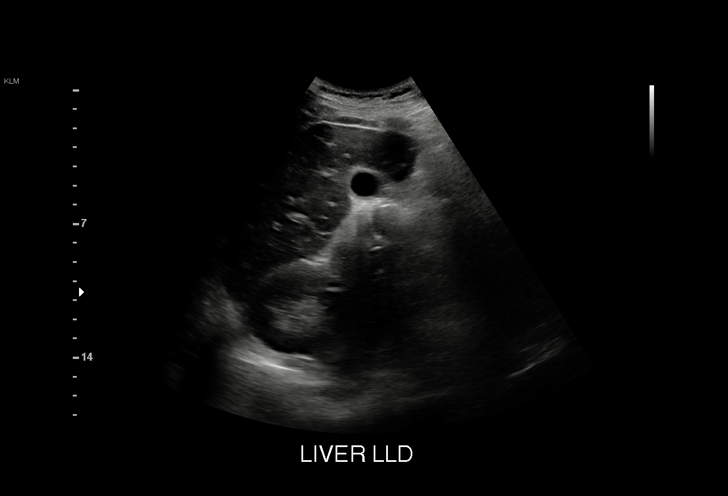

[15 of 25 positions shown; findings below may reference images not displayed]

FINDINGS: Gallbladder:

Gallbladder is somewhat contracted but no evidence of stones or
sludge. No pericholecystic edema. No wall thickening. Murphy's sign
is negative.

Common bile duct:

Diameter: 3 mm, normal

Liver:

Focal geographic areas of increased echotexture seen in the liver
consistent with geographic areas of focal fatty infiltration. Portal
vein is patent on color Doppler imaging with normal direction of
blood flow towards the liver.

Other: None.
IMPRESSION: No evidence of cholelithiasis or acute cholecystitis. Geographic
areas of fatty infiltration in the liver.
